# Patient Record
Sex: Female | Born: 1975 | Race: Black or African American | Hispanic: No | Marital: Single | Smoking: Never smoker
Health system: Southern US, Community
[De-identification: ages and names within clinical notes are randomized; demographics above are authoritative.]

## PROBLEM LIST (undated history)

## (undated) DIAGNOSIS — S83242A Other tear of medial meniscus, current injury, left knee, initial encounter: Secondary | ICD-10-CM

## (undated) DIAGNOSIS — D219 Benign neoplasm of connective and other soft tissue, unspecified: Secondary | ICD-10-CM

## (undated) DIAGNOSIS — S83105A Unspecified dislocation of left knee, initial encounter: Secondary | ICD-10-CM

## (undated) DIAGNOSIS — M25562 Pain in left knee: Secondary | ICD-10-CM

## (undated) DIAGNOSIS — D649 Anemia, unspecified: Secondary | ICD-10-CM

## (undated) DIAGNOSIS — I10 Essential (primary) hypertension: Secondary | ICD-10-CM

## (undated) HISTORY — DX: Benign neoplasm of connective and other soft tissue, unspecified: D21.9

---

## 1999-01-13 ENCOUNTER — Encounter: Admission: RE | Admit: 1999-01-13 | Discharge: 1999-01-13 | Payer: Self-pay | Admitting: Obstetrics & Gynecology

## 2010-12-28 ENCOUNTER — Emergency Department (HOSPITAL_COMMUNITY): Payer: Self-pay

## 2010-12-28 ENCOUNTER — Emergency Department (HOSPITAL_COMMUNITY)
Admission: EM | Admit: 2010-12-28 | Discharge: 2010-12-29 | Disposition: A | Discharge status: Home / Self Care | Payer: Self-pay | Attending: Emergency Medicine | Admitting: Emergency Medicine

## 2010-12-28 DIAGNOSIS — R071 Chest pain on breathing: Secondary | ICD-10-CM | POA: Insufficient documentation

## 2010-12-28 DIAGNOSIS — R079 Chest pain, unspecified: Secondary | ICD-10-CM | POA: Insufficient documentation

## 2010-12-28 DIAGNOSIS — R55 Syncope and collapse: Secondary | ICD-10-CM | POA: Insufficient documentation

## 2010-12-28 LAB — CBC
Hemoglobin: 11.2 g/dL — ABNORMAL LOW (ref 12.0–15.0)
MCH: 21.6 pg — ABNORMAL LOW (ref 26.0–34.0)
MCV: 66.1 fL — ABNORMAL LOW (ref 78.0–100.0)
Platelets: 238 10*3/uL (ref 150–400)
RBC: 5.19 MIL/uL — ABNORMAL HIGH (ref 3.87–5.11)

## 2010-12-28 LAB — BASIC METABOLIC PANEL
CO2: 26 mEq/L (ref 19–32)
Glucose, Bld: 109 mg/dL — ABNORMAL HIGH (ref 70–99)
Potassium: 4 mEq/L (ref 3.5–5.1)
Sodium: 138 mEq/L (ref 135–145)

## 2010-12-28 LAB — DIFFERENTIAL
Eosinophils Absolute: 0 10*3/uL (ref 0.0–0.7)
Lymphs Abs: 1.9 10*3/uL (ref 0.7–4.0)
Monocytes Relative: 8 % (ref 3–12)
Neutrophils Relative %: 70 % (ref 43–77)

## 2010-12-28 LAB — CK TOTAL AND CKMB (NOT AT ARMC)
CK, MB: 3.7 ng/mL (ref 0.3–4.0)
CK, MB: 3.9 ng/mL (ref 0.3–4.0)
Relative Index: 1.5 (ref 0.0–2.5)
Total CK: 307 U/L — ABNORMAL HIGH (ref 7–177)

## 2010-12-28 LAB — TROPONIN I: Troponin I: <0.3 ng/mL (ref <0.30)

## 2012-04-29 ENCOUNTER — Encounter (HOSPITAL_COMMUNITY): Payer: Self-pay | Admitting: *Deleted

## 2012-04-29 ENCOUNTER — Emergency Department (HOSPITAL_COMMUNITY): Payer: Medicaid Other

## 2012-04-29 ENCOUNTER — Emergency Department (HOSPITAL_COMMUNITY)
Admission: EM | Admit: 2012-04-29 | Discharge: 2012-04-29 | Disposition: A | Discharge status: Home / Self Care | Payer: Medicaid Other | Attending: Emergency Medicine | Admitting: Emergency Medicine

## 2012-04-29 DIAGNOSIS — S90129A Contusion of unspecified lesser toe(s) without damage to nail, initial encounter: Secondary | ICD-10-CM

## 2012-04-29 DIAGNOSIS — Y939 Activity, unspecified: Secondary | ICD-10-CM | POA: Insufficient documentation

## 2012-04-29 DIAGNOSIS — W208XXA Other cause of strike by thrown, projected or falling object, initial encounter: Secondary | ICD-10-CM | POA: Insufficient documentation

## 2012-04-29 DIAGNOSIS — Y929 Unspecified place or not applicable: Secondary | ICD-10-CM | POA: Insufficient documentation

## 2012-04-29 MED ORDER — HYDROCODONE-ACETAMINOPHEN 5-325 MG PO TABS: 2.0000 | ORAL_TABLET | Freq: Four times a day (QID) | ORAL | Status: DC | PRN

## 2012-04-29 NOTE — ED Notes (Signed)
Pt reports dropping a toy chest on her left foot yesterday, having pain to left big toe, radiates into her leg. Ambulatory at triage.

## 2012-04-29 NOTE — ED Provider Notes (Signed)
History     CSN: [id number]  Arrival date & time 04/29/12  1029   First MD Initiated Contact with Patient 04/29/12 1134      Chief Complaint  Patient presents with  . Toe Injury    (Consider location/radiation/quality/duration/timing/severity/associated sxs/prior treatment) HPI Comments: Dropped toy chest on foot last night.  Pain and swelling since.  Worse with ambulation, movement.  Better with rest.    The history is provided by the patient.    History reviewed. No pertinent past medical history.  History reviewed. No pertinent past surgical history.  History reviewed. No pertinent family history.  History  Substance Use Topics  . Smoking status: Not on file  . Smokeless tobacco: Not on file  . Alcohol Use: No    OB History   Grav Para Term Preterm Abortions TAB SAB Ect Mult Living                  Review of Systems  All other systems reviewed and are negative.    Allergies  Penicillins  Home Medications   Current Outpatient Rx  Name  Route  Sig  Dispense  Refill  . ibuprofen (ADVIL,MOTRIN) 800 MG tablet   Oral   Take 400 mg by mouth once.         . Omega-3 Fatty Acids (FISH OIL PO)   Oral   Take 2 capsules by mouth 2 (two) times daily.           BP 133/75  Pulse 91  Temp(Src) 98.2 F (36.8 C) (Oral)  Resp 18  SpO2 99%  LMP 04/01/2012  Physical Exam  Nursing note and vitals reviewed. Constitutional: She is oriented to person, place, and time. She appears well-developed and well-nourished.  HENT:  Head: Normocephalic and atraumatic.  Neck: Normal range of motion.  Musculoskeletal:  There is mild swelling, significant ttp over the left mtp joint and left great toe.  There is no deformity.  Neurological: She is alert and oriented to person, place, and time.  Skin: Skin is warm and dry.    ED Course  Procedures (including critical care time)  Labs Reviewed - No data to display Dg Foot Complete Left  04/29/2012  *RADIOLOGY REPORT*   Clinical Data: Object fell on foot with pain in the great toe  LEFT FOOT - COMPLETE 3+ VIEW  Comparison: None.  Findings: Tarsal - metatarsal alignment is normal.  No acute fracture is seen.  Joint spaces appear normal.  IMPRESSION: Negative.   Original Report Authenticated By: Dwyane Dee, M.D.      No diagnosis found.    MDM  Contusion of toe.  Treat with ice, pain meds.  Return prn.        Geoffery Lyons, MD 04/29/12 [phone]

## 2018-03-01 ENCOUNTER — Emergency Department (HOSPITAL_COMMUNITY)
Admission: EM | Admit: 2018-03-01 | Discharge: 2018-03-01 | Disposition: A | Discharge status: Home / Self Care | Payer: Medicaid Other | Attending: Emergency Medicine | Admitting: Emergency Medicine

## 2018-03-01 ENCOUNTER — Emergency Department (HOSPITAL_COMMUNITY): Payer: Medicaid Other

## 2018-03-01 ENCOUNTER — Other Ambulatory Visit: Payer: Self-pay

## 2018-03-01 ENCOUNTER — Encounter (HOSPITAL_COMMUNITY): Payer: Self-pay

## 2018-03-01 DIAGNOSIS — X509XXA Other and unspecified overexertion or strenuous movements or postures, initial encounter: Secondary | ICD-10-CM | POA: Diagnosis not present

## 2018-03-01 DIAGNOSIS — Y990 Civilian activity done for income or pay: Secondary | ICD-10-CM | POA: Insufficient documentation

## 2018-03-01 DIAGNOSIS — Z79899 Other long term (current) drug therapy: Secondary | ICD-10-CM | POA: Diagnosis not present

## 2018-03-01 DIAGNOSIS — Y9289 Other specified places as the place of occurrence of the external cause: Secondary | ICD-10-CM | POA: Insufficient documentation

## 2018-03-01 DIAGNOSIS — M2392 Unspecified internal derangement of left knee: Secondary | ICD-10-CM | POA: Diagnosis not present

## 2018-03-01 DIAGNOSIS — Y9389 Activity, other specified: Secondary | ICD-10-CM | POA: Diagnosis not present

## 2018-03-01 DIAGNOSIS — M25562 Pain in left knee: Secondary | ICD-10-CM | POA: Diagnosis present

## 2018-03-01 HISTORY — DX: Anemia, unspecified: D64.9

## 2018-03-01 NOTE — ED Triage Notes (Signed)
Pt reports left knee pain and swelling for 1 week, pt states she hears a "pop" when she walks on it.

## 2018-03-01 NOTE — ED Notes (Signed)
Ortho tech paged at this time

## 2018-03-01 NOTE — ED Provider Notes (Signed)
[facility] Provider Note   CSN: [id number] Arrival date & time: 03/01/18  1256     History   Chief Complaint Chief Complaint  Patient presents with  . Knee Pain    HPI Nancy Davis is a 42 y.o. female.  HPI Patient with left knee pain.  Around a week ago she hurt it.  States she was working and felt a pop in her left knee.  States there was a swelling on the top part of the knee after it.  States she has had pain since.  Some pain with moving it.  States she used to get cortisone in it after previous injuries with sports.  Pain is continued.  No fevers.  No other injury.  No numbness. Past Medical History:  Diagnosis Date  . Anemia     There are no active problems to display for this patient.   Past Surgical History:  Procedure Laterality Date  . CESAREAN SECTION  2007     OB History   None      Home Medications    Prior to Admission medications   Medication Sig Start Date End Date Taking? Authorizing Provider  HYDROcodone-acetaminophen (NORCO/VICODIN) 5-325 MG per tablet Take 2 tablets by mouth every 6 (six) hours as needed for pain. 04/29/12   Veryl Speak, MD  ibuprofen (ADVIL,MOTRIN) 800 MG tablet Take 400 mg by mouth once.    [provider]  Omega-3 Fatty Acids (FISH OIL PO) Take 2 capsules by mouth 2 (two) times daily.    [provider]    Family History No family history on file.  Social History Social History   Tobacco Use  . Smoking status: Not on file  Substance Use Topics  . Alcohol use: No  . Drug use: No     Allergies   Penicillins   Review of Systems Review of Systems  Constitutional: Negative for appetite change and fever.  Musculoskeletal:       Left knee pain.  Skin: Negative for rash and wound.  Neurological: Negative for weakness and numbness.     Physical Exam Updated Vital Signs BP (!) 153/81 (BP Location: Right Arm)   Pulse 77   Temp 98.5 F (36.9 C)  (Oral)   Resp 18   Ht 5\' 8"  (1.727 m)   Wt 85.3 kg   SpO2 99%   BMI 28.59 kg/m   Physical Exam  Constitutional: She appears well-developed.  Musculoskeletal: She exhibits tenderness.  Some swelling of the left knee.  Decreased range of motion due to pain.  Pain with anterior drawer testing.  Less pain with varus and valgus strain.  Some tenderness over anterior knee.  No erythema.  Skin: Skin is warm. Capillary refill takes less than 2 seconds.     ED Treatments / Results  Labs (all labs ordered are listed, but only abnormal results are displayed) Labs Reviewed - No data to display  EKG None  Radiology Dg Knee Complete 4 Views Left  Result Date: 03/01/2018 CLINICAL DATA:  Anterior left knee pain and swelling. The patient feels like the knee popped out of place and back in one week ago. EXAM: LEFT KNEE - COMPLETE 4+ VIEW COMPARISON:  None. FINDINGS: There is a moderate knee joint effusion. No acute fracture or dislocation is identified. Joint space widths are preserved. A punctate calcification is noted in the soft tissues adjacent to the fibular head. There is mild soft tissue swelling anterior to the  knee. IMPRESSION: Moderate knee joint effusion without acute osseous abnormality identified. Electronically Signed   By: Logan Bores M.D.   On: 03/01/2018 13:38    Procedures Procedures (including critical care time)  Medications Ordered in ED Medications - No data to display   Initial Impression / Assessment and Plan / ED Course  I have reviewed the triage vital signs and the nursing notes.  Pertinent labs & imaging results that were available during my care of the patient were reviewed by me and considered in my medical decision making (see chart for details).     Patient with left knee pain.  Acute injury around a week ago.  Has effusion.  Likely some internal derangement.  Knee immobilizer.  Orthopedic follow-up.  Final Clinical Impressions(s) / ED Diagnoses   Final  diagnoses:  Internal derangement of left knee    ED Discharge Orders    None       Davonna Belling, MD 03/01/18 1352

## 2018-03-01 NOTE — ED Notes (Signed)
ED Provider at bedside. 

## 2018-03-02 ENCOUNTER — Ambulatory Visit (INDEPENDENT_AMBULATORY_CARE_PROVIDER_SITE_OTHER): Payer: Medicaid Other | Admitting: Orthopaedic Surgery

## 2018-03-02 DIAGNOSIS — M25462 Effusion, left knee: Secondary | ICD-10-CM

## 2018-03-02 MED ORDER — METHYLPREDNISOLONE ACETATE 40 MG/ML IJ SUSP
40.0000 mg | INTRAMUSCULAR | Status: AC | PRN
  Administered 2018-03-02: 40 mg via INTRA_ARTICULAR

## 2018-03-02 MED ORDER — LIDOCAINE HCL 1 % IJ SOLN
2.0000 mL | INTRAMUSCULAR | Status: AC | PRN
  Administered 2018-03-02: 2 mL

## 2018-03-02 MED ORDER — BUPIVACAINE HCL 0.5 % IJ SOLN
2.0000 mL | INTRAMUSCULAR | Status: AC | PRN
  Administered 2018-03-02: 2 mL via INTRA_ARTICULAR

## 2018-03-02 NOTE — Addendum Note (Signed)
Addended by: Precious Bard on: 03/02/2018 11:37 AM   Modules accepted: Orders

## 2018-03-02 NOTE — Progress Notes (Signed)
Office Visit Note   Patient: Nancy Davis           Date of Birth: 13-Jan-1976           MRN: [id number] Visit Date: 03/02/2018              Requested by: No referring provider defined for this encounter. PCP: Default, Provider, MD   Assessment & Plan: Visit Diagnoses:  1. Effusion, left knee     Plan: Impression is left knee medial meniscal tear concerning for a bucket-handle.  X-rays from the ED are unremarkable other than large joint effusion.  We performed a left knee aspiration and cortisone injection today.  I am worried that she has a large medial meniscal tear possibly bucket-handle which needs further investigation with an MRI.  We will see her back after the MRI.  Work note was provided today for desk duty until she follows up.  Follow-Up Instructions: Return in about 10 days (around 03/12/2018).   Orders:  No orders of the defined types were placed in this encounter.  No orders of the defined types were placed in this encounter.     Procedures: Large Joint Inj: L knee on 03/02/2018 9:50 AM Details: 22 G needle Medications: 2 mL bupivacaine 0.5 %; 2 mL lidocaine 1 %; 40 mg methylPREDNISolone acetate 40 MG/ML Aspirate: 25 mL clear Outcome: tolerated well, no immediate complications Patient was prepped and draped in the usual sterile fashion.       Clinical Data: No additional findings.   Subjective: Chief Complaint  Patient presents with  . Left Knee - Pain    Nancy Davis is a very pleasant 42 year old female who acutely developed left knee pain about a week ago when she was walking at work.  She did not have an injury but she felt a pop in her left knee on the medial side and had immediate pain and swelling and difficulty with weightbearing walking.  She was sent home from work.  She has had previous left knee injuries and she has had multiple cortisone injections.  She comes in today with pain and knee effusion and significant mechanical  symptoms.   Review of Systems  Constitutional: Negative.   HENT: Negative.   Eyes: Negative.   Respiratory: Negative.   Cardiovascular: Negative.   Endocrine: Negative.   Musculoskeletal: Negative.   Neurological: Negative.   Hematological: Negative.   Psychiatric/Behavioral: Negative.   All other systems reviewed and are negative.    Objective: Vital Signs: There were no vitals taken for this visit.  Physical Exam Vitals signs and nursing note reviewed.  Constitutional:      Appearance: She is well-developed.  HENT:     Head: Normocephalic and atraumatic.  Neck:     Musculoskeletal: Neck supple.  Pulmonary:     Effort: Pulmonary effort is normal.  Abdominal:     Palpations: Abdomen is soft.  Skin:    General: Skin is warm.     Capillary Refill: Capillary refill takes less than 2 seconds.  Neurological:     Mental Status: She is alert and oriented to person, place, and time.  Psychiatric:        Behavior: Behavior normal.        Thought Content: Thought content normal.        Judgment: Judgment normal.     Ortho Exam Left knee exam shows significant difficulty with ambulation with frequent buckling and giving way.  She exhibits a large left knee  joint effusion.  Collaterals and cruciates are stable.  She has exquisite medial joint line tenderness with a positive McMurray. Specialty Comments:  No specialty comments available.  Imaging: Dg Knee Complete 4 Views Left  Result Date: 03/01/2018 CLINICAL DATA:  Anterior left knee pain and swelling. The patient feels like the knee popped out of place and back in one week ago. EXAM: LEFT KNEE - COMPLETE 4+ VIEW COMPARISON:  None. FINDINGS: There is a moderate knee joint effusion. No acute fracture or dislocation is identified. Joint space widths are preserved. A punctate calcification is noted in the soft tissues adjacent to the fibular head. There is mild soft tissue swelling anterior to the knee. IMPRESSION: Moderate  knee joint effusion without acute osseous abnormality identified. Electronically Signed   By: Logan Bores M.D.   On: 03/01/2018 13:38     PMFS History: There are no active problems to display for this patient.  Past Medical History:  Diagnosis Date  . Anemia     No family history on file.  Past Surgical History:  Procedure Laterality Date  . CESAREAN SECTION  2007   Social History   Occupational History  . Not on file  Tobacco Use  . Smoking status: Not on file  Substance and Sexual Activity  . Alcohol use: No  . Drug use: No  . Sexual activity: Not on file

## 2018-04-09 ENCOUNTER — Encounter (HOSPITAL_COMMUNITY): Payer: Self-pay

## 2018-04-09 ENCOUNTER — Emergency Department (HOSPITAL_COMMUNITY)
Admission: EM | Admit: 2018-04-09 | Discharge: 2018-04-09 | Disposition: A | Discharge status: Home / Self Care | Payer: Medicaid Other | Attending: Emergency Medicine | Admitting: Emergency Medicine

## 2018-04-09 DIAGNOSIS — Z79899 Other long term (current) drug therapy: Secondary | ICD-10-CM | POA: Insufficient documentation

## 2018-04-09 DIAGNOSIS — M25562 Pain in left knee: Secondary | ICD-10-CM | POA: Diagnosis present

## 2018-04-09 DIAGNOSIS — M1710 Unilateral primary osteoarthritis, unspecified knee: Secondary | ICD-10-CM

## 2018-04-09 DIAGNOSIS — M25462 Effusion, left knee: Secondary | ICD-10-CM | POA: Diagnosis not present

## 2018-04-09 HISTORY — DX: Pain in left knee: M25.562

## 2018-04-09 MED ORDER — NAPROXEN 250 MG PO TABS
500.0000 mg | ORAL_TABLET | Freq: Once | ORAL | Status: AC
  Administered 2018-04-09: 500 mg via ORAL
  Filled 2018-04-09: qty 2

## 2018-04-09 MED ORDER — HYDROCODONE-ACETAMINOPHEN 5-325 MG PO TABS
1.0000 | ORAL_TABLET | Freq: Once | ORAL | Status: AC
  Administered 2018-04-09: 1 via ORAL
  Filled 2018-04-09: qty 1

## 2018-04-09 NOTE — Progress Notes (Signed)
Orthopedic Tech Progress Note Patient Details:  Nancy Davis Mar 10, 1976 [id number]  Ortho Devices Type of Ortho Device: Knee Sleeve Ortho Device/Splint Location: LLE Ortho Device/Splint Interventions: Adjustment, Application, Ordered   Post Interventions Patient Tolerated: Well Instructions Provided: Care of device, Adjustment of device   [name] 04/09/2018, 1:12 PM

## 2018-04-09 NOTE — ED Provider Notes (Signed)
[facility]PART[facility] Provider Note   CSN: [id number] Arrival date & time: 04/09/18  1208     History   Chief Complaint Chief Complaint  Patient presents with  . Knee Pain    HPI Nancy Davis is a 43 y.o. female.  HPI  43 year old female comes in with chief complaint of knee pain. Patient has known history of chronic knee injury to the left side, and she saw orthopedic service last month and had her knee aspirated and was given hydrocortisone shot.  Patient reports that her symptoms improved for a few days, but have slowly started getting worse and now she has any swelling.  She has sharp and stabbing pain to her knee that is fairly constant and it is worse at nighttime.  Patient has been taking NSAIDs without significant relief.  She denies any new trauma.  Past Medical History:  Diagnosis Date  . Anemia   . Knee pain, left    from old injury playing sports in high school,     There are no active problems to display for this patient.   Past Surgical History:  Procedure Laterality Date  . CESAREAN SECTION  2007     OB History   No obstetric history on file.      Home Medications    Prior to Admission medications   Medication Sig Start Date End Date Taking? Authorizing Provider  HYDROcodone-acetaminophen (NORCO/VICODIN) 5-325 MG per tablet Take 2 tablets by mouth every 6 (six) hours as needed for pain. 04/29/12   Veryl Speak, MD  ibuprofen (ADVIL,MOTRIN) 800 MG tablet Take 400 mg by mouth once.    [provider]  Omega-3 Fatty Acids (FISH OIL PO) Take 2 capsules by mouth 2 (two) times daily.    [provider]    Family History History reviewed. No pertinent family history.  Social History Social History   Tobacco Use  . Smoking status: Never Smoker  Substance Use Topics  . Alcohol use: No  . Drug use: No     Allergies   Penicillins   Review of Systems Review of Systems  Constitutional:  Positive for activity change. Negative for fever.  Musculoskeletal: Positive for arthralgias.  Allergic/Immunologic: Negative for immunocompromised state.     Physical Exam Updated Vital Signs BP 129/72   Pulse 88   Temp 98 F (36.7 C) (Oral)   Resp 16   LMP 03/22/2018   SpO2 99%   Physical Exam Vitals signs and nursing note reviewed.  Constitutional:      Appearance: She is well-developed.  HENT:     Head: Atraumatic.  Cardiovascular:     Rate and Rhythm: Normal rate.  Pulmonary:     Effort: Pulmonary effort is normal.  Musculoskeletal:        General: Swelling and tenderness present. No deformity or signs of injury.     Comments: Left knee has effusion without any erythema, warmth to touch  Skin:    General: Skin is warm and dry.  Neurological:     Mental Status: She is alert and oriented to person, place, and time.      ED Treatments / Results  Labs (all labs ordered are listed, but only abnormal results are displayed) Labs Reviewed - No data to display  EKG None  Radiology No results found.  Procedures Procedures (including critical care time)  Medications Ordered in ED Medications  naproxen (NAPROSYN) tablet 500 mg (has no administration in time range)  HYDROcodone-acetaminophen (NORCO/VICODIN) 5-325 MG per tablet 1 tablet (has no administration in time range)     Initial Impression / Assessment and Plan / ED Course  I have reviewed the triage vital signs and the nursing notes.  Pertinent labs & imaging results that were available during my care of the patient were reviewed by me and considered in my medical decision making (see chart for details).     43 year old comes in a chief complaint of knee pain. Clinically it appears that patient is having reaccumulation of the effusion secondary to her chronic knee issues.  Is already seeing an orthopedist, and has had joint aspiration with transient relief.  Clinical concern for septic arthritis at  this time.  We will provide a knee sleeve for extra support.  Norco for pain control right now, however at home she will be on NSAIDs.  To follow-up recommended for further evaluation.  Final Clinical Impressions(s) / ED Diagnoses   Final diagnoses:  Effusion of left knee  Arthritis of knee    ED Discharge Orders    None       Varney Biles, MD 04/09/18 1252

## 2018-04-09 NOTE — ED Triage Notes (Signed)
Onset last night left knee pain and swelling with h/o same from "old" sports injury. No recent injuries to knee. Unable to bear full weight on left leg.

## 2018-04-09 NOTE — Discharge Instructions (Addendum)
Please see your orthopedic doctor for further evaluation of your knee swelling and pain. Continue taking ibuprofen 400 to 600 mg 4 times a day. Keep the knee sleeve on at all times for extra support. Continue with ice and heat therapy 4 times a day.

## 2018-04-18 ENCOUNTER — Encounter (INDEPENDENT_AMBULATORY_CARE_PROVIDER_SITE_OTHER): Payer: Self-pay | Admitting: *Deleted

## 2018-09-23 ENCOUNTER — Other Ambulatory Visit: Payer: Self-pay

## 2018-09-23 ENCOUNTER — Emergency Department (HOSPITAL_COMMUNITY): Payer: Medicaid Other

## 2018-09-23 ENCOUNTER — Emergency Department (HOSPITAL_COMMUNITY)
Admission: EM | Admit: 2018-09-23 | Discharge: 2018-09-24 | Disposition: A | Discharge status: Home / Self Care | Payer: Medicaid Other | Attending: Emergency Medicine | Admitting: Emergency Medicine

## 2018-09-23 ENCOUNTER — Encounter (HOSPITAL_COMMUNITY): Payer: Self-pay | Admitting: *Deleted

## 2018-09-23 DIAGNOSIS — S8391XA Sprain of unspecified site of right knee, initial encounter: Secondary | ICD-10-CM | POA: Diagnosis not present

## 2018-09-23 DIAGNOSIS — Y929 Unspecified place or not applicable: Secondary | ICD-10-CM | POA: Insufficient documentation

## 2018-09-23 DIAGNOSIS — Y9389 Activity, other specified: Secondary | ICD-10-CM | POA: Diagnosis not present

## 2018-09-23 DIAGNOSIS — Y999 Unspecified external cause status: Secondary | ICD-10-CM | POA: Insufficient documentation

## 2018-09-23 DIAGNOSIS — S8991XA Unspecified injury of right lower leg, initial encounter: Secondary | ICD-10-CM | POA: Diagnosis present

## 2018-09-23 DIAGNOSIS — X58XXXA Exposure to other specified factors, initial encounter: Secondary | ICD-10-CM | POA: Diagnosis not present

## 2018-09-23 MED ORDER — OXYCODONE-ACETAMINOPHEN 5-325 MG PO TABS
1.0000 | ORAL_TABLET | Freq: Once | ORAL | Status: AC
  Administered 2018-09-23: 1 via ORAL
  Filled 2018-09-23: qty 1

## 2018-09-23 NOTE — ED Provider Notes (Signed)
[facility] EMERGENCY DEPARTMENT Provider Note   CSN: [id number] Arrival date & time: 09/23/18  2126     History   Chief Complaint Chief Complaint  Patient presents with  . Knee Pain    HPI Nancy Davis is a 43 y.o. female.     Patient to ED with complaint of right knee pain and swelling since earlier today. She reports a remote sports injury that causes periodic pain and swelling of the knee. She woke this morning with these symptoms but later in the day was walking down steps when she felt the knee give way. She did not fall but had sudden pain and has difficulty walking since that time. She has tried ice and elevation, and has been taking ibuprofen and Tylenol without relief. No numbness.   The history is provided by the patient. No language interpreter was used.  Knee Pain   Past Medical History:  Diagnosis Date  . Anemia   . Knee pain, left    from old injury playing sports in high school,     There are no active problems to display for this patient.   Past Surgical History:  Procedure Laterality Date  . CESAREAN SECTION  2007     OB History   No obstetric history on file.      Home Medications    Prior to Admission medications   Medication Sig Start Date End Date Taking? Authorizing Provider  HYDROcodone-acetaminophen (NORCO/VICODIN) 5-325 MG per tablet Take 2 tablets by mouth every 6 (six) hours as needed for pain. 04/29/12   Veryl Speak, MD  ibuprofen (ADVIL,MOTRIN) 800 MG tablet Take 400 mg by mouth once.    [provider]  Omega-3 Fatty Acids (FISH OIL PO) Take 2 capsules by mouth 2 (two) times daily.    [provider]    Family History No family history on file.  Social History Social History   Tobacco Use  . Smoking status: Never Smoker  Substance Use Topics  . Alcohol use: No  . Drug use: No     Allergies   Penicillins   Review of Systems Review of Systems  Musculoskeletal: Positive for  arthralgias.       See HPI.  Skin: Negative for color change and wound.  Neurological: Negative for numbness.     Physical Exam Updated Vital Signs BP (!) 169/100   Pulse 77   Temp 98.5 F (36.9 C) (Oral)   Resp 16   LMP 08/24/2018   SpO2 99%   Physical Exam Vitals signs and nursing note reviewed.  Constitutional:      Appearance: She is well-developed.  Neck:     Musculoskeletal: Normal range of motion.  Cardiovascular:     Pulses: Normal pulses.  Pulmonary:     Effort: Pulmonary effort is normal.  Musculoskeletal:     Comments: Right knee swollen medially, possible small effusion. No bony deformity. Laxity cannot be determined due to limitation of pain on exam. No calf or thigh tenderness.   Skin:    General: Skin is warm and dry.  Neurological:     Mental Status: She is alert and oriented to person, place, and time.      ED Treatments / Results  Labs (all labs ordered are listed, but only abnormal results are displayed) Labs Reviewed - No data to display  EKG None  Radiology Dg Knee Complete 4 Views Right  Result Date: 09/23/2018 CLINICAL DATA:  Knee pain EXAM: RIGHT  KNEE - COMPLETE 4+ VIEW COMPARISON:  None. FINDINGS: No acute displaced fracture or malalignment. Mild medial degenerative changes. Possible small knee effusion with associated fat density. IMPRESSION: 1. No definite acute displaced fracture is seen. 2. Possible small knee effusion with associated fat density. Consider CT to exclude radiographically occult fracture Electronically Signed   By: Donavan Foil M.D.   On: 09/23/2018 22:37    Procedures Procedures (including critical care time)  Medications Ordered in ED Medications  oxyCODONE-acetaminophen (PERCOCET/ROXICET) 5-325 MG per tablet 1 tablet (has no administration in time range)     Initial Impression / Assessment and Plan / ED Course  I have reviewed the triage vital signs and the nursing notes.  Pertinent labs & imaging results  that were available during my care of the patient were reviewed by me and considered in my medical decision making (see chart for details).        Patient to ED with knee pain and swelling today. Increased pain and difficulty weight bearing when the knee "gave out" while walking down steps.   The mechanism of injury suggests ligamentous injury/strain. No evidence of rupture. Plain film of the knee shows small effusion, consider CT to exclude occult fracture.  The patient was given the choice to treat symptomatically: with or without CT, immobilization, continue cool compresses, pain control, ortho follow up. She requests CT study to be done.   Pain management provided. CT pending.   CT shows effusion without fracture. Knee Immobilizer and crutches provided, which is providing comfort. Will discharge home with pain control, ortho follow up.  Final Clinical Impressions(s) / ED Diagnoses   Final diagnoses:  None   1. Right knee sprain  ED Discharge Orders    None       Charlann Lange, PA-C 09/24/18 [place], [place], DO 09/24/18 2340

## 2018-09-23 NOTE — ED Triage Notes (Signed)
Pt c/o R knee pain; has previous injury; was walking down the steps and heard it pop; c/o increased pain on ambulation. Had tried ice and ibuprofen without relief

## 2018-09-24 MED ORDER — HYDROCODONE-ACETAMINOPHEN 5-325 MG PO TABS: 1.0000 | ORAL_TABLET | Freq: Four times a day (QID) | ORAL | 0 refills | Status: DC | PRN

## 2018-09-24 MED ORDER — IBUPROFEN 600 MG PO TABS: 600.0000 mg | ORAL_TABLET | Freq: Four times a day (QID) | ORAL | 0 refills | Status: DC | PRN

## 2018-09-24 NOTE — Discharge Instructions (Addendum)
Take medications as prescribed. Use knee immobilizer when walking, and crutches to be weight bearing as tolerated.   If pain and swelling persist, follow up with orthopedics for further evaluation.

## 2018-09-24 NOTE — ED Notes (Signed)
Patient verbalizes understanding of discharge instructions. Opportunity for questioning and answers were provided. Armband removed by staff, pt discharged from ED by wheelchair with significant other to pick up pt

## 2018-11-23 ENCOUNTER — Encounter (HOSPITAL_COMMUNITY): Payer: Self-pay

## 2018-11-23 ENCOUNTER — Other Ambulatory Visit: Payer: Self-pay

## 2018-11-23 ENCOUNTER — Emergency Department (HOSPITAL_COMMUNITY): Payer: Medicaid Other

## 2018-11-23 ENCOUNTER — Emergency Department (HOSPITAL_COMMUNITY)
Admission: EM | Admit: 2018-11-23 | Discharge: 2018-11-23 | Disposition: A | Discharge status: Home / Self Care | Payer: Medicaid Other | Attending: Emergency Medicine | Admitting: Emergency Medicine

## 2018-11-23 DIAGNOSIS — M25511 Pain in right shoulder: Secondary | ICD-10-CM

## 2018-11-23 DIAGNOSIS — Z79899 Other long term (current) drug therapy: Secondary | ICD-10-CM | POA: Insufficient documentation

## 2018-11-23 MED ORDER — METHOCARBAMOL 500 MG PO TABS: 500.0000 mg | ORAL_TABLET | Freq: Two times a day (BID) | ORAL | 0 refills | Status: DC

## 2018-11-23 NOTE — Discharge Instructions (Signed)
Use Robaxin for pain at night.  Do not drink alcohol or operate heavy machinery with this medication can cause drowsiness.  Read attached information on shoulder exercises and musculoskeletal pain.  Follow-up with orthopedist as discussed. Use ibuprofen and tylenol for pain management as discussed.
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
## 2018-11-23 NOTE — ED Triage Notes (Signed)
Pt presents with Right shoulder pain x1-2 weeks

## 2019-07-25 ENCOUNTER — Emergency Department (HOSPITAL_COMMUNITY): Payer: Medicaid Other

## 2019-07-25 ENCOUNTER — Emergency Department (HOSPITAL_COMMUNITY)
Admission: EM | Admit: 2019-07-25 | Discharge: 2019-07-25 | Disposition: A | Discharge status: Home / Self Care | Payer: Medicaid Other | Attending: Emergency Medicine | Admitting: Emergency Medicine

## 2019-07-25 DIAGNOSIS — R042 Hemoptysis: Secondary | ICD-10-CM | POA: Diagnosis not present

## 2019-07-25 DIAGNOSIS — J40 Bronchitis, not specified as acute or chronic: Secondary | ICD-10-CM | POA: Diagnosis not present

## 2019-07-25 LAB — CBC
HCT: 37.9 % (ref 36.0–46.0)
Hemoglobin: 11.2 g/dL — ABNORMAL LOW (ref 12.0–15.0)
MCH: 20.4 pg — ABNORMAL LOW (ref 26.0–34.0)
MCHC: 29.6 g/dL — ABNORMAL LOW (ref 30.0–36.0)
MCV: 69 fL — ABNORMAL LOW (ref 80.0–100.0)
Platelets: 253 10*3/uL (ref 150–400)
RBC: 5.49 MIL/uL — ABNORMAL HIGH (ref 3.87–5.11)
RDW: 15.2 % (ref 11.5–15.5)
WBC: 6.1 10*3/uL (ref 4.0–10.5)
nRBC: 0 % (ref 0.0–0.2)

## 2019-07-25 LAB — BASIC METABOLIC PANEL
Anion gap: 7 (ref 5–15)
BUN: 16 mg/dL (ref 6–20)
CO2: 25 mmol/L (ref 22–32)
Calcium: 9 mg/dL (ref 8.9–10.3)
Chloride: 107 mmol/L (ref 98–111)
Creatinine, Ser: 0.84 mg/dL (ref 0.44–1.00)
GFR calc Af Amer: >60 mL/min (ref >60)
GFR calc non Af Amer: >60 mL/min (ref >60)
Glucose, Bld: 95 mg/dL (ref 70–99)
Potassium: 4.2 mmol/L (ref 3.5–5.1)
Sodium: 139 mmol/L (ref 135–145)

## 2019-07-25 LAB — I-STAT BETA HCG BLOOD, ED (MC, WL, AP ONLY): I-stat hCG, quantitative: <5 m[IU]/mL (ref <5)

## 2019-07-25 MED ORDER — KETOROLAC TROMETHAMINE 15 MG/ML IJ SOLN
15.0000 mg | Freq: Once | INTRAMUSCULAR | Status: AC
  Administered 2019-07-25: 15 mg via INTRAVENOUS
  Filled 2019-07-25: qty 1

## 2019-07-25 MED ORDER — BENZONATATE 100 MG PO CAPS: 100.0000 mg | ORAL_CAPSULE | Freq: Three times a day (TID) | ORAL | 0 refills | Status: DC

## 2019-07-25 MED ORDER — IOHEXOL 350 MG/ML SOLN
80.0000 mL | Freq: Once | INTRAVENOUS | Status: AC | PRN
  Administered 2019-07-25: 80 mL via INTRAVENOUS

## 2019-07-25 MED ORDER — SODIUM CHLORIDE 0.9% FLUSH
3.0000 mL | Freq: Once | INTRAVENOUS | Status: AC
  Administered 2019-07-25: 12:00:00 3 mL via INTRAVENOUS

## 2019-07-25 NOTE — ED Notes (Signed)
Patient verbalizes understanding of discharge instructions. Opportunity for questioning and answers were provided. Armband removed by staff, pt discharged from ED.  

## 2019-07-25 NOTE — Discharge Instructions (Addendum)
You were evaluated in the Emergency Department and after careful evaluation, we did not find any emergent condition requiring admission or further testing in the hospital.  Your exam/testing today was overall reassuring.  CT scan did not show any signs of blood clots or any other emergencies.  We suspect your symptoms are due to bronchitis.  You can use the Tessalon anticough medication as needed.  Otherwise we recommend Tylenol 1000 mg every 4-6 hours, Motrin 600 mg every 4-6 hours for discomfort.  Please return to the Emergency Department if you experience any worsening of your condition.  We encourage you to follow up with a primary care provider.  Thank you for allowing [place] to be a part of your care.

## 2019-07-25 NOTE — ED Provider Notes (Signed)
[facility] Emergency Department Provider Note MRN:  [id number]  Arrival date & time: 07/25/19     Chief Complaint   Hemoptysis History of Present Illness   Nancy Davis is a 44 y.o. year-old female with a history of anemia presenting to the ED with chief complaint of hemoptysis.  1 week of persistent hemoptysis associated with dry cough, coughing up streaks of blood with mucus.  Also endorsing pleuritic central chest pain during this time, trouble taking deep breaths due to the pain.  Denies abdominal pain, no fever, mild nasal congestion.  She explains that she has a strong family history of blood clots on her mother side and her sister died of a blood clot last week.  Review of Systems  A complete 10 system review of systems was obtained and all systems are negative except as noted in the HPI and PMH.   Patient's Health History    Past Medical History:  Diagnosis Date  . Anemia   . Knee pain, left    from old injury playing sports in high school,     Past Surgical History:  Procedure Laterality Date  . CESAREAN SECTION  2007    No family history on file.  Social History   Socioeconomic History  . Marital status: Single    Spouse name: Not on file  . Number of children: Not on file  . Years of education: Not on file  . Highest education level: Not on file  Occupational History  . Not on file  Tobacco Use  . Smoking status: Never Smoker  . Smokeless tobacco: Never Used  Substance and Sexual Activity  . Alcohol use: No  . Drug use: No  . Sexual activity: Not on file  Other Topics Concern  . Not on file  Social History Narrative  . Not on file   Social Determinants of Health   Financial Resource Strain:   . Difficulty of Paying Living Expenses:   Food Insecurity:   . Worried About Charity fundraiser in the Last Year:   . Arboriculturist in the Last Year:   Transportation Needs:   . Film/video editor (Medical):   Marland Kitchen Lack of  Transportation (Non-Medical):   Physical Activity:   . Days of Exercise per Week:   . Minutes of Exercise per Session:   Stress:   . Feeling of Stress :   Social Connections:   . Frequency of Communication with Friends and Family:   . Frequency of Social Gatherings with Friends and Family:   . Attends Religious Services:   . Active Member of Clubs or Organizations:   . Attends Archivist Meetings:   Marland Kitchen Marital Status:   Intimate Partner Violence:   . Fear of Current or Ex-Partner:   . Emotionally Abused:   Marland Kitchen Physically Abused:   . Sexually Abused:      Physical Exam   Vitals:   07/25/19 1003  BP: (!) 151/85  Pulse: 82  Resp: 18  Temp: 98 F (36.7 C)  SpO2: 100%    CONSTITUTIONAL: Well-appearing, NAD NEURO:  Alert and oriented x 3, no focal deficits EYES:  eyes equal and reactive ENT/NECK:  no LAD, no JVD CARDIO: Regular rate, well-perfused, normal S1 and S2 PULM:  CTAB no wheezing or rhonchi GI/GU:  normal bowel sounds, non-distended, non-tender MSK/SPINE:  No gross deformities, no edema SKIN:  no rash, atraumatic PSYCH:  Appropriate speech and behavior  *Additional and/or  pertinent findings included in MDM below  Diagnostic and Interventional Summary    EKG Interpretation  Date/Time:  Wednesday Jul 25 2019 11:42:31 EDT Ventricular Rate:  66 PR Interval:    QRS Duration: 93 QT Interval:  414 QTC Calculation: 434 R Axis:   6 Text Interpretation: Sinus rhythm Confirmed by Gerlene Fee [phone]) on 07/25/2019 2:24:04 PM      Labs Reviewed  CBC - Abnormal; Notable for the following components:      Result Value   RBC 5.49 (*)    Hemoglobin 11.2 (*)    MCV 69.0 (*)    MCH 20.4 (*)    MCHC 29.6 (*)    All other components within normal limits  BASIC METABOLIC PANEL  I-STAT BETA HCG BLOOD, ED ([facility], [facility], [facility] ONLY)    CT ANGIO CHEST PE W OR WO CONTRAST  Final Result    DG Chest 2 View  Final Result      Medications  sodium chloride flush (NS)  0.9 % injection 3 mL (3 mLs Intravenous Given 07/25/19 1151)  ketorolac (TORADOL) 15 MG/ML injection 15 mg (15 mg Intravenous Given 07/25/19 1151)  iohexol (OMNIPAQUE) 350 MG/ML injection 80 mL (80 mLs Intravenous Contrast Given 07/25/19 1245)     Procedures  /  Critical Care Procedures  ED Course and Medical Decision Making  I have reviewed the triage vital signs, the nursing notes, and pertinent available records from the EMR.  Listed above are laboratory and imaging tests that I personally ordered, reviewed, and interpreted and then considered in my medical decision making (see below for details).      Given the mopped assist and pleuritic chest pain with the strong family history, will obtain CTA to exclude pulmonary embolism.  Work-up is reassuring, no PE on CTA.  Patient is feeling better, suspect mild hemoptysis due to bronchitis, appropriate for discharge.  Barth Kirks. Sedonia Small, [place] [email or web address]  Final Clinical Impressions(s) / ED Diagnoses     ICD-10-CM   1. Hemoptysis  R04.2   2. Bronchitis  J40     ED Discharge Orders         Ordered    benzonatate (TESSALON) 100 MG capsule  Every 8 hours     07/25/19 1425           Discharge Instructions Discussed with and Provided to Patient:     Discharge Instructions     You were evaluated in the Emergency Department and after careful evaluation, we did not find any emergent condition requiring admission or further testing in the hospital.  Your exam/testing today was overall reassuring.  CT scan did not show any signs of blood clots or any other emergencies.  We suspect your symptoms are due to bronchitis.  You can use the Tessalon anticough medication as needed.  Otherwise we recommend Tylenol 1000 mg every 4-6 hours, Motrin 600 mg every 4-6 hours for discomfort.  Please return to the Emergency Department if you experience any worsening of your condition.  We  encourage you to follow up with a primary care provider.  Thank you for allowing [place] to be a part of your care.        Maudie Flakes, MD 07/25/19 [phone]

## 2019-07-25 NOTE — ED Triage Notes (Signed)
Pt arrives pov with reports of coughing up blood X1 week. Pt also states it hurts to breathe. States she has had a cough/cold for the last few weeks. Endorses receiving covid vaccines.

## 2019-10-22 ENCOUNTER — Encounter (HOSPITAL_COMMUNITY): Payer: Self-pay | Admitting: Emergency Medicine

## 2019-10-22 ENCOUNTER — Emergency Department (HOSPITAL_COMMUNITY)
Admission: EM | Admit: 2019-10-22 | Discharge: 2019-10-22 | Disposition: A | Discharge status: Home / Self Care | Payer: Medicaid Other | Attending: Emergency Medicine | Admitting: Emergency Medicine

## 2019-10-22 ENCOUNTER — Emergency Department (HOSPITAL_COMMUNITY): Payer: Medicaid Other

## 2019-10-22 ENCOUNTER — Other Ambulatory Visit: Payer: Self-pay

## 2019-10-22 DIAGNOSIS — M25561 Pain in right knee: Secondary | ICD-10-CM | POA: Diagnosis present

## 2019-10-22 MED ORDER — TRAMADOL HCL 50 MG PO TABS: 50.0000 mg | ORAL_TABLET | Freq: Four times a day (QID) | ORAL | 0 refills | Status: DC | PRN

## 2019-10-22 NOTE — Discharge Instructions (Addendum)
It was our pleasure to provide your ER care today - we hope that you feel better.  Your xrays look good.   For pain, try ibuprofen or aleve as need.   You may also take ultram as need for pain - no driving when taking.   Follow up with orthopedist in the next few weeks - call office to arrange appointment.   Return to ER if worse, new symptoms, fevers, severe or intractable pain, severe redness/swelling, or other concern.

## 2019-10-22 NOTE — ED Provider Notes (Signed)
[place] EMERGENCY DEPARTMENT Provider Note   CSN: [id number] Arrival date & time: 10/22/19  1040     History Chief Complaint  Patient presents with  . Knee Pain    Nancy Davis is a 44 y.o. female.  Patient c/o right knee pain that shoots upwards to right anterior thigh. Symptoms presents for past several days, worse w walking/wt bearing. Symptoms gradual, onset, dull, moderate, worse w walking/certain movements. Denies specific injury. No trauma/fall. Pt indicates believes is from old/remote sports injury with periodic knee pain since. Denies prior knee surgery. No swelling to leg. No redness to skin. No fever or chills. Denies any hip or ankle pain. No radicular type pain. No extremity numbness/weakness. Pt reports trying otc meds for pain without significant relief.   The history is provided by the patient.  Knee Pain Associated symptoms: no fever        Past Medical History:  Diagnosis Date  . Anemia   . Knee pain, left    from old injury playing sports in high school,     There are no problems to display for this patient.   Past Surgical History:  Procedure Laterality Date  . CESAREAN SECTION  2007     OB History   No obstetric history on file.     No family history on file.  Social History   Tobacco Use  . Smoking status: Never Smoker  . Smokeless tobacco: Never Used  Substance Use Topics  . Alcohol use: No  . Drug use: No    Home Medications Prior to Admission medications   Medication Sig Start Date End Date Taking? Authorizing Provider  acetaminophen (TYLENOL) 325 MG tablet Take 650 mg by mouth every 6 (six) hours as needed for mild pain or headache.    [provider]  benzonatate (TESSALON) 100 MG capsule Take 1 capsule (100 mg total) by mouth every 8 (eight) hours. 07/25/19   Maudie Flakes, MD  HYDROcodone-acetaminophen (NORCO/VICODIN) 5-325 MG tablet Take 1 tablet by mouth every 6 (six) hours as needed for  moderate pain or severe pain. Patient not taking: Reported on 07/25/2019 09/24/18   Charlann Lange, PA-C  ibuprofen (ADVIL) 600 MG tablet Take 1 tablet (600 mg total) by mouth every 6 (six) hours as needed. Patient not taking: Reported on 07/25/2019 09/24/18   Charlann Lange, PA-C  methocarbamol (ROBAXIN) 500 MG tablet Take 1 tablet (500 mg total) by mouth 2 (two) times daily. Patient not taking: Reported on 07/25/2019 11/23/18   Tedd Sias, PA    Allergies    Penicillins  Review of Systems   Review of Systems  Constitutional: Negative for chills and fever.  Respiratory: Negative for shortness of breath.   Cardiovascular: Negative for chest pain and leg swelling.  Musculoskeletal:       Right knee pain  Skin: Negative for rash and wound.  Neurological: Negative for weakness and numbness.    Physical Exam Updated Vital Signs BP (!) 135/69 (BP Location: Right Arm)   Pulse 72   Temp 98.2 F (36.8 C) (Oral)   Resp 18   Wt 81.6 kg   LMP 10/21/2019   SpO2 99%   BMI 27.35 kg/m   Physical Exam Vitals and nursing note reviewed.  Constitutional:      Appearance: Normal appearance. She is well-developed.  HENT:     Head: Atraumatic.     Nose: Nose normal.     Mouth/Throat:     Mouth:  Mucous membranes are moist.  Eyes:     General: No scleral icterus.    Conjunctiva/sclera: Conjunctivae normal.  Neck:     Trachea: No tracheal deviation.  Cardiovascular:     Rate and Rhythm: Normal rate.     Pulses: Normal pulses.  Pulmonary:     Effort: Pulmonary effort is normal. No respiratory distress.  Genitourinary:    Comments: No cva tenderness.  Musculoskeletal:     Cervical back: Neck supple. No muscular tenderness.     Comments: Good passive rom right knee without pain. No effusion. Knee is stable. No leg erythema or swelling noted. Distal pulses palp.   Skin:    General: Skin is warm and dry.     Findings: No rash.  Neurological:     Mental Status: She is alert.     Comments:  Alert, speech normal. Motor/sens grossly intact. Steady gait.   Psychiatric:        Mood and Affect: Mood normal.     ED Results / Procedures / Treatments   Labs (all labs ordered are listed, but only abnormal results are displayed) Labs Reviewed - No data to display  EKG None  Radiology DG Knee Complete 4 Views Right  Result Date: 10/22/2019 CLINICAL DATA:  Right knee pain after injury today. EXAM: RIGHT KNEE - COMPLETE 4+ VIEW COMPARISON:  September 23, 2018. FINDINGS: No evidence of fracture, dislocation, or joint effusion. No evidence of arthropathy or other focal bone abnormality. Soft tissues are unremarkable. IMPRESSION: Negative. Electronically Signed   By: Marijo Conception M.D.   On: 10/22/2019 12:22    Procedures Procedures (including critical care time)  Medications Ordered in ED Medications - No data to display  ED Course  I have reviewed the triage vital signs and the nursing notes.  Pertinent labs & imaging results that were available during my care of the patient were reviewed by me and considered in my medical decision making (see chart for details).    MDM Rules/Calculators/A&P                         Xrays.  Reviewed nursing notes and prior charts for additional history.   Xrays reviewed/interpreted by me - no effusion, no fx.   As pt reports otc meds not helping, will give small quantity rx of ultram, and rec ortho f/u.  Return precautions provided.    Final Clinical Impression(s) / ED Diagnoses Final diagnoses:  None    Rx / DC Orders ED Discharge Orders    None       Lajean Saver, MD 10/22/19 1431

## 2019-10-22 NOTE — ED Triage Notes (Signed)
Pt in w/pain and swelling to R knee x few days. States she felt and heard a pop this morning, and pain got worse. Able to walk, painful to squat or straighten.

## 2019-12-07 NOTE — H&P (Signed)
PHI   43yof presents with c/o of pain in the right knee. The patient comes in for follow up MRI.  She was last seen on 08/27 and at that time was aspirated and injected; 20 cc were aspirated during this process.  She states that she continues to have pain mostly in the medial aspect of her knee.  MRI was obtained and showed a radial medial meniscus tear of the posterior horn.  She has not had any new falls or injuries.  She continues to walk with a limp.  No new concerns.     Past Medical History:  Diagnosis Date  . Anemia    . Knee pain, left      from old injury playing sports in high school,     Past Surgical History:  Procedure Laterality Date  . CESAREAN SECTION   2007                 OB History   No obstetric history on file.        No family history on file.   Social History        Tobacco Use  . Smoking status: Never Smoker  . Smokeless tobacco: Never Used  Substance Use Topics  . Alcohol use: No  . Drug use: No      Home Medications        Prior to Admission medications   Medication Sig Start Date End Date Taking? Authorizing Provider  acetaminophen (TYLENOL) 325 MG tablet Take 650 mg by mouth every 6 (six) hours as needed for mild pain or headache.       [provider]  benzonatate (TESSALON) 100 MG capsule Take 1 capsule (100 mg total) by mouth every 8 (eight) hours. 07/25/19     Maudie Flakes, MD  HYDROcodone-acetaminophen (NORCO/VICODIN) 5-325 MG tablet Take 1 tablet by mouth every 6 (six) hours as needed for moderate pain or severe pain. Patient not taking: Reported on 07/25/2019 09/24/18     Charlann Lange, PA-C  ibuprofen (ADVIL) 600 MG tablet Take 1 tablet (600 mg total) by mouth every 6 (six) hours as needed. Patient not taking: Reported on 07/25/2019 09/24/18     Charlann Lange, PA-C  methocarbamol (ROBAXIN) 500 MG tablet Take 1 tablet (500 mg total) by mouth 2 (two) times daily. Patient not taking: Reported on 07/25/2019 11/23/18     Tedd Sias,  PA      Allergies            Penicillins   Physical Exam  Gen: Well appearing female, NAD. Pulm: CTA b/l Card: RRR, No MRG Abd: Soft, NT, ND RLE: Inspection of the knee shows very mild effusion.  She has tenderness to palpation along the medial joint line, especially posteriorly.  She also has pain over the patellar tendon.  She is nontender over lateral joint line.  She has decreased range of motion 0-130 degrees, limited due to pain somewhat.  She has 5/5 strength with flexion and extension of the knee.  She is neurovascularly intact distally.  Negative Lachman's, negative posterior jerk, negative varus and valgus testing.    MRI Right Knee  shows radial tear of the posterior horn of the medial meniscus; otherwise relatively unremarkable MRI.  She does have an effusion and some inflammation of Hoffa's fat pad.    Assessment 43yof with Radial tear of the medial meniscus  Plan.    Patient with signs and symptoms most consistent with the meniscus tear  that was seen on MRI.  Discussed options with patient.  She would like to proceed with arthroscopic surgery for repair.  We discussed the surgery at length, and patient was scheduled for surgery today. Plan to follow up only if needed prior to surgery. Continue conservative management with relative rest for now.

## 2019-12-28 ENCOUNTER — Other Ambulatory Visit (HOSPITAL_COMMUNITY)
Admission: RE | Admit: 2019-12-28 | Discharge: 2019-12-28 | Disposition: A | Discharge status: Home / Self Care | Payer: Medicaid Other | Source: Ambulatory Visit | Attending: Orthopedic Surgery | Admitting: Orthopedic Surgery

## 2019-12-28 DIAGNOSIS — Z20822 Contact with and (suspected) exposure to covid-19: Secondary | ICD-10-CM | POA: Insufficient documentation

## 2019-12-28 DIAGNOSIS — Z01818 Encounter for other preprocedural examination: Secondary | ICD-10-CM | POA: Insufficient documentation

## 2019-12-28 LAB — SARS CORONAVIRUS 2 (TAT 6-24 HRS): SARS Coronavirus 2: NEGATIVE

## 2019-12-31 ENCOUNTER — Other Ambulatory Visit: Payer: Self-pay

## 2019-12-31 ENCOUNTER — Encounter (HOSPITAL_BASED_OUTPATIENT_CLINIC_OR_DEPARTMENT_OTHER): Payer: Self-pay | Admitting: Orthopedic Surgery

## 2019-12-31 NOTE — Progress Notes (Signed)
Spoke w/ via phone for pre-op interview---pt Lab needs dos----  Cbc, bmet, urine poct             Lab results------ekg 07-25-2019 epic, ekg 07-25-2019 epic COVID test ------12-28-2019 Arrive at -------767 am 01-01-2020 NPO after MN NO Solid Food.  Clear liquids from MN until---545 am then npo Medications to take morning of surgery -----none Diabetic medication -----n/a Patient Special Instructions -----none Pre-Op special Istructions -----none Patient verbalized understanding of instructions that were given at this phone interview. Patient denies shortness of breath, chest pain, fever, cough at this phone interview.

## 2019-12-31 NOTE — Anesthesia Preprocedure Evaluation (Addendum)
Anesthesia Evaluation  Patient identified by MRN, date of birth, ID band Patient awake    Reviewed: Allergy & Precautions, NPO status , Patient's Chart, lab work & pertinent test results  Airway Mallampati: I       Dental no notable dental hx.    Pulmonary neg pulmonary ROS,    Pulmonary exam normal        Cardiovascular hypertension, Pt. on medications Normal cardiovascular exam     Neuro/Psych negative neurological ROS  negative psych ROS   GI/Hepatic negative GI ROS, Neg liver ROS,   Endo/Other    Renal/GU negative Renal ROS  negative genitourinary   Musculoskeletal   Abdominal Normal abdominal exam  (+)   Peds  Hematology   Anesthesia Other Findings   Reproductive/Obstetrics                            Anesthesia Physical Anesthesia Plan  ASA: II  Anesthesia Plan: General   Post-op Pain Management:    Induction:   PONV Risk Score and Plan: 4 or greater and Ondansetron, Dexamethasone and Midazolam  Airway Management Planned: LMA  Additional Equipment: None  Intra-op Plan:   Post-operative Plan: Extubation in OR  Informed Consent: I have reviewed the patients History and Physical, chart, labs and discussed the procedure including the risks, benefits and alternatives for the proposed anesthesia with the patient or authorized representative who has indicated his/her understanding and acceptance.     Dental advisory given  Plan Discussed with: CRNA  Anesthesia Plan Comments:        Anesthesia Quick Evaluation

## 2020-01-01 ENCOUNTER — Ambulatory Visit (HOSPITAL_BASED_OUTPATIENT_CLINIC_OR_DEPARTMENT_OTHER)
Admission: RE | Admit: 2020-01-01 | Discharge: 2020-01-01 | Disposition: A | Discharge status: Home / Self Care | Payer: Medicaid Other | Attending: Orthopedic Surgery | Admitting: Orthopedic Surgery

## 2020-01-01 ENCOUNTER — Ambulatory Visit (HOSPITAL_BASED_OUTPATIENT_CLINIC_OR_DEPARTMENT_OTHER): Payer: Medicaid Other | Admitting: Anesthesiology

## 2020-01-01 ENCOUNTER — Encounter (HOSPITAL_BASED_OUTPATIENT_CLINIC_OR_DEPARTMENT_OTHER)
Admission: RE | Disposition: A | Discharge status: Home / Self Care | Payer: Self-pay | Source: Home / Self Care | Attending: Orthopedic Surgery

## 2020-01-01 ENCOUNTER — Encounter (HOSPITAL_BASED_OUTPATIENT_CLINIC_OR_DEPARTMENT_OTHER): Payer: Self-pay | Admitting: Orthopedic Surgery

## 2020-01-01 DIAGNOSIS — S83242A Other tear of medial meniscus, current injury, left knee, initial encounter: Secondary | ICD-10-CM | POA: Insufficient documentation

## 2020-01-01 DIAGNOSIS — Z880 Allergy status to penicillin: Secondary | ICD-10-CM | POA: Diagnosis not present

## 2020-01-01 DIAGNOSIS — X58XXXA Exposure to other specified factors, initial encounter: Secondary | ICD-10-CM | POA: Insufficient documentation

## 2020-01-01 HISTORY — DX: Unspecified dislocation of left knee, initial encounter: S83.105A

## 2020-01-01 HISTORY — DX: Essential (primary) hypertension: I10

## 2020-01-01 HISTORY — DX: Other tear of medial meniscus, current injury, left knee, initial encounter: S83.242A

## 2020-01-01 HISTORY — PX: KNEE ARTHROSCOPY WITH MEDIAL MENISECTOMY: SHX5651

## 2020-01-01 LAB — BASIC METABOLIC PANEL
Anion gap: 14 (ref 5–15)
BUN: 24 mg/dL — ABNORMAL HIGH (ref 6–20)
CO2: 24 mmol/L (ref 22–32)
Calcium: 9.4 mg/dL (ref 8.9–10.3)
Chloride: 105 mmol/L (ref 98–111)
Creatinine, Ser: 0.81 mg/dL (ref 0.44–1.00)
GFR, Estimated: >60 mL/min (ref >60)
Glucose, Bld: 99 mg/dL (ref 70–99)
Potassium: 3.7 mmol/L (ref 3.5–5.1)
Sodium: 143 mmol/L (ref 135–145)

## 2020-01-01 LAB — CBC
HCT: 36.6 % (ref 36.0–46.0)
Hemoglobin: 11 g/dL — ABNORMAL LOW (ref 12.0–15.0)
MCH: 20 pg — ABNORMAL LOW (ref 26.0–34.0)
MCHC: 30.1 g/dL (ref 30.0–36.0)
MCV: 66.5 fL — ABNORMAL LOW (ref 80.0–100.0)
Platelets: 284 10*3/uL (ref 150–400)
RBC: 5.5 MIL/uL — ABNORMAL HIGH (ref 3.87–5.11)
RDW: 16 % — ABNORMAL HIGH (ref 11.5–15.5)
WBC: 6.1 10*3/uL (ref 4.0–10.5)
nRBC: 0 % (ref 0.0–0.2)

## 2020-01-01 LAB — POCT PREGNANCY, URINE: Preg Test, Ur: NEGATIVE

## 2020-01-01 SURGERY — ARTHROSCOPY, KNEE, WITH MEDIAL MENISCECTOMY
Anesthesia: General | Site: Knee | Laterality: Right

## 2020-01-01 MED ORDER — DEXAMETHASONE SODIUM PHOSPHATE 10 MG/ML IJ SOLN
INTRAMUSCULAR | Status: DC | PRN
  Administered 2020-01-01: 10 mg via INTRAVENOUS

## 2020-01-01 MED ORDER — HYDROCODONE-ACETAMINOPHEN 5-325 MG PO TABS
1.0000 | ORAL_TABLET | ORAL | 0 refills | Status: AC | PRN
Start: 2020-01-01 — End: 2020-01-06

## 2020-01-01 MED ORDER — MEPERIDINE HCL 25 MG/ML IJ SOLN: 6.2500 mg | INTRAMUSCULAR | Status: DC | PRN

## 2020-01-01 MED ORDER — ACETAMINOPHEN 325 MG PO TABS: 325.0000 mg | ORAL_TABLET | ORAL | Status: DC | PRN

## 2020-01-01 MED ORDER — KETOROLAC TROMETHAMINE 30 MG/ML IJ SOLN: 30.0000 mg | Freq: Once | INTRAMUSCULAR | Status: DC | PRN

## 2020-01-01 MED ORDER — VANCOMYCIN HCL IN DEXTROSE 1-5 GM/200ML-% IV SOLN
INTRAVENOUS | Status: AC
  Filled 2020-01-01: qty 200

## 2020-01-01 MED ORDER — MIDAZOLAM HCL 2 MG/2ML IJ SOLN
INTRAMUSCULAR | Status: AC
  Filled 2020-01-01: qty 2

## 2020-01-01 MED ORDER — KETOROLAC TROMETHAMINE 30 MG/ML IJ SOLN
INTRAMUSCULAR | Status: DC | PRN
  Administered 2020-01-01: 30 mg via INTRAVENOUS

## 2020-01-01 MED ORDER — FENTANYL CITRATE (PF) 100 MCG/2ML IJ SOLN
INTRAMUSCULAR | Status: AC
  Filled 2020-01-01: qty 2

## 2020-01-01 MED ORDER — HYDROMORPHONE HCL 2 MG/ML IJ SOLN
INTRAMUSCULAR | Status: AC
  Filled 2020-01-01: qty 1

## 2020-01-01 MED ORDER — FENTANYL CITRATE (PF) 100 MCG/2ML IJ SOLN
INTRAMUSCULAR | Status: DC | PRN
  Administered 2020-01-01: 50 ug via INTRAVENOUS
  Administered 2020-01-01 (×2): 25 ug via INTRAVENOUS

## 2020-01-01 MED ORDER — ONDANSETRON HCL 4 MG/2ML IJ SOLN
INTRAMUSCULAR | Status: AC
  Filled 2020-01-01: qty 2

## 2020-01-01 MED ORDER — GLYCOPYRROLATE PF 0.2 MG/ML IJ SOSY
PREFILLED_SYRINGE | INTRAMUSCULAR | Status: AC
  Filled 2020-01-01: qty 1

## 2020-01-01 MED ORDER — OXYCODONE HCL 5 MG PO TABS: 5.0000 mg | ORAL_TABLET | Freq: Once | ORAL | Status: DC | PRN

## 2020-01-01 MED ORDER — DEXAMETHASONE SODIUM PHOSPHATE 10 MG/ML IJ SOLN
INTRAMUSCULAR | Status: AC
  Filled 2020-01-01: qty 1

## 2020-01-01 MED ORDER — HYDROMORPHONE HCL 1 MG/ML IJ SOLN
INTRAMUSCULAR | Status: DC | PRN
  Administered 2020-01-01: .5 mg via INTRAVENOUS

## 2020-01-01 MED ORDER — ACETAMINOPHEN 160 MG/5ML PO SOLN: 325.0000 mg | ORAL | Status: DC | PRN

## 2020-01-01 MED ORDER — METHYLPREDNISOLONE ACETATE 80 MG/ML IJ SUSP
INTRAMUSCULAR | Status: DC | PRN
  Administered 2020-01-01: 80 mg via INTRA_ARTICULAR

## 2020-01-01 MED ORDER — LIDOCAINE 2% (20 MG/ML) 5 ML SYRINGE
INTRAMUSCULAR | Status: AC
  Filled 2020-01-01: qty 5

## 2020-01-01 MED ORDER — ONDANSETRON HCL 4 MG/2ML IJ SOLN: 4.0000 mg | Freq: Once | INTRAMUSCULAR | Status: DC | PRN

## 2020-01-01 MED ORDER — ONDANSETRON HCL 4 MG/2ML IJ SOLN
INTRAMUSCULAR | Status: DC | PRN
  Administered 2020-01-01: 4 mg via INTRAVENOUS

## 2020-01-01 MED ORDER — MIDAZOLAM HCL 5 MG/5ML IJ SOLN
INTRAMUSCULAR | Status: DC | PRN
  Administered 2020-01-01: 2 mg via INTRAVENOUS

## 2020-01-01 MED ORDER — OXYCODONE HCL 5 MG/5ML PO SOLN: 5.0000 mg | Freq: Once | ORAL | Status: DC | PRN

## 2020-01-01 MED ORDER — BUPIVACAINE HCL 0.5 % IJ SOLN
INTRAMUSCULAR | Status: DC | PRN
  Administered 2020-01-01: 5 mL via INTRA_ARTICULAR

## 2020-01-01 MED ORDER — LIDOCAINE 2% (20 MG/ML) 5 ML SYRINGE
INTRAMUSCULAR | Status: DC | PRN
  Administered 2020-01-01: 100 mg via INTRAVENOUS

## 2020-01-01 MED ORDER — LACTATED RINGERS IV SOLN: INTRAVENOUS | Status: DC

## 2020-01-01 MED ORDER — VANCOMYCIN HCL IN DEXTROSE 1-5 GM/200ML-% IV SOLN
1000.0000 mg | INTRAVENOUS | Status: AC
  Administered 2020-01-01: 1000 mg via INTRAVENOUS

## 2020-01-01 MED ORDER — PROPOFOL 10 MG/ML IV BOLUS
INTRAVENOUS | Status: AC
  Filled 2020-01-01: qty 40

## 2020-01-01 MED ORDER — GLYCOPYRROLATE 0.2 MG/ML IJ SOLN
INTRAMUSCULAR | Status: DC | PRN
  Administered 2020-01-01: .1 mg via INTRAVENOUS

## 2020-01-01 MED ORDER — PROPOFOL 10 MG/ML IV BOLUS
INTRAVENOUS | Status: DC | PRN
  Administered 2020-01-01: 200 mg via INTRAVENOUS
  Administered 2020-01-01: 50 mg via INTRAVENOUS

## 2020-01-01 MED ORDER — SODIUM CHLORIDE 0.9 % IR SOLN
Status: DC | PRN
  Administered 2020-01-01: 6000 mL

## 2020-01-01 MED ORDER — KETOROLAC TROMETHAMINE 30 MG/ML IJ SOLN
INTRAMUSCULAR | Status: AC
  Filled 2020-01-01: qty 1

## 2020-01-01 MED ORDER — FENTANYL CITRATE (PF) 100 MCG/2ML IJ SOLN
25.0000 ug | INTRAMUSCULAR | Status: DC | PRN
  Administered 2020-01-01: 50 ug via INTRAVENOUS

## 2020-01-01 SURGICAL SUPPLY — 29 items
APL PRP STRL LF DISP 70% ISPRP (MISCELLANEOUS) ×1
BNDG ELASTIC 6X5.8 VLCR STR LF (GAUZE/BANDAGES/DRESSINGS) ×3 IMPLANT
CHLORAPREP W/TINT 26 (MISCELLANEOUS) ×3 IMPLANT
COVER WAND RF STERILE (DRAPES) ×3 IMPLANT
CUFF TOURN SGL QUICK 34 (TOURNIQUET CUFF) ×3
CUFF TRNQT CYL 34X4.125X (TOURNIQUET CUFF) ×1 IMPLANT
DISSECTOR  3.8MM X 13CM (MISCELLANEOUS) ×3
DISSECTOR 3.8MM X 13CM (MISCELLANEOUS) ×1 IMPLANT
DRAPE ARTHROSCOPY W/POUCH 114 (DRAPES) ×3 IMPLANT
DRAPE IMP U-DRAPE 54X76 (DRAPES) ×3 IMPLANT
DRAPE U-SHAPE 47X51 STRL (DRAPES) ×3 IMPLANT
DRSG EMULSION OIL 3X3 NADH (GAUZE/BANDAGES/DRESSINGS) ×3 IMPLANT
DRSG PAD ABDOMINAL 8X10 ST (GAUZE/BANDAGES/DRESSINGS) ×2 IMPLANT
EXCALIBUR 3.8MM X 13CM (MISCELLANEOUS) ×2 IMPLANT
GAUZE SPONGE 4X4 12PLY STRL (GAUZE/BANDAGES/DRESSINGS) ×3 IMPLANT
GLOVE BIO SURGEON STRL SZ7.5 (GLOVE) ×6 IMPLANT
GLOVE BIOGEL PI IND STRL 8 (GLOVE) ×2 IMPLANT
GLOVE BIOGEL PI INDICATOR 8 (GLOVE) ×4
GOWN STRL REUS W/TWL LRG LVL3 (GOWN DISPOSABLE) ×6 IMPLANT
IV NS IRRIG 3000ML ARTHROMATIC (IV SOLUTION) ×6 IMPLANT
MANIFOLD NEPTUNE II (INSTRUMENTS) ×3 IMPLANT
PACK ARTHROSCOPY DSU (CUSTOM PROCEDURE TRAY) ×3 IMPLANT
PACK BASIN DAY SURGERY FS (CUSTOM PROCEDURE TRAY) ×3 IMPLANT
PORT APPOLLO RF 90DEGREE MULTI (SURGICAL WAND) IMPLANT
SUT ETHILON 3 0 PS 1 (SUTURE) ×3 IMPLANT
TAPE CLOTH 3X10 TAN LF (GAUZE/BANDAGES/DRESSINGS) IMPLANT
TOWEL OR 17X26 10 PK STRL BLUE (TOWEL DISPOSABLE) ×3 IMPLANT
TUBING ARTHROSCOPY IRRIG 16FT (MISCELLANEOUS) ×3 IMPLANT
WATER STERILE IRR 1000ML POUR (IV SOLUTION) ×3 IMPLANT

## 2020-01-01 NOTE — Discharge Instructions (Signed)
Keep leg elevated with ice to reduce pain and swelling. You may increase pain medication up to 2 tablets every 4 hours for the first few days post op if needed.  Stop as needed pain medication as soon as you are able.  Diet: As you were doing prior to hospitalization   Dressing:  You may remove dressings and shower over incisions 3 days after surgery.  Place clean Band-Aid over incisions.  Continue to use ace wrap for compression to reduce swelling.   Activity:  Increase activity slowly as tolerated, but follow the weight bearing instructions below.  The rules on driving is that you can not be taking narcotics while you drive, and you must feel in control of the vehicle.    Weight Bearing: As tolerated   To prevent constipation: you may use a stool softener such as -  Colace (over the counter) 100 mg by mouth twice a day  Drink plenty of fluids (prune juice may be helpful) and high fiber foods Miralax (over the counter) for constipation as needed.    Itching:  If you experience itching with your medications, try taking only a single pain pill, or even half a pain pill at a time.  You can also use benadryl over the counter for itching or also to help with sleep.   Precautions:  If you experience chest pain or shortness of breath - call 911 immediately for transfer to the hospital emergency department!!  If you develop a fever greater that 101 F, purulent drainage from wound, increased redness or drainage from wound, or calf pain -- Call the office at [phone]                                                 Follow- Up Appointment:  Please call for an appointment to be seen in 2 weeks [place] - [phone]

## 2020-01-01 NOTE — Transfer of Care (Signed)
Immediate Anesthesia Transfer of Care Note  Patient: Nancy Davis  Procedure(s) Performed: KNEE ARTHROSCOPY WITH MEDIAL MENISECTOMY and chrondroplasty (Right Knee)  Patient Location: PACU  Anesthesia Type:General  Level of Consciousness: drowsy and patient cooperative  Airway & Oxygen Therapy: Patient Spontanous Breathing and Patient connected to face mask oxygen  Post-op Assessment: Report given to RN and Post -op Vital signs reviewed and stable  Post vital signs: Reviewed and stable  Last Vitals:  Vitals Value Taken Time  BP 123/73 01/01/20 0933  Temp    Pulse 94 01/01/20 0936  Resp 24 01/01/20 0936  SpO2 98 % 01/01/20 0936  Vitals shown include unvalidated device data.  Last Pain:  Vitals:   01/01/20 0631  TempSrc: Oral  PainSc: 0-No pain      Patients Stated Pain Goal: 4 (77/11/65 7903)  Complications: No complications documented.

## 2020-01-01 NOTE — Interval H&P Note (Signed)
History and Physical Interval Note:  01/01/2020 6:21 AM  Nancy Davis  has presented today for surgery, with the diagnosis of LEFT KNEE MEDIAL MENISCUS TEAR.  The various methods of treatment have been discussed with the patient and family. After consideration of risks, benefits and other options for treatment, the patient has consented to  Procedure(s): KNEE ARTHROSCOPY WITH MEDIAL MENISECTOMY (Left) as a surgical intervention.  The patient's history has been reviewed, patient examined, no change in status, stable for surgery.  I have reviewed the patient's chart and labs.  Questions were answered to the patient's satisfaction.     Renette Butters

## 2020-01-01 NOTE — Anesthesia Postprocedure Evaluation (Signed)
Anesthesia Post Note  Patient: Nancy Davis  Procedure(s) Performed: KNEE ARTHROSCOPY WITH MEDIAL MENISECTOMY and chrondroplasty (Right Knee)     Patient location during evaluation: PACU Anesthesia Type: General Level of consciousness: awake and sedated Pain management: pain level controlled Vital Signs Assessment: post-procedure vital signs reviewed and stable Respiratory status: spontaneous breathing Cardiovascular status: stable Postop Assessment: no apparent nausea or vomiting Anesthetic complications: no   No complications documented.  Last Vitals:  Vitals:   01/01/20 1015 01/01/20 1030  BP: 138/85 134/74  Pulse: (!) 59 62  Resp: 15 17  Temp:  36.5 C  SpO2: 94% 96%    Last Pain:  Vitals:   01/01/20 1030  TempSrc:   PainSc: 0-No pain   Pain Goal: Patients Stated Pain Goal: 4 (01/01/20 1015)                 Huston Foley

## 2020-01-01 NOTE — Anesthesia Procedure Notes (Signed)
Procedure Name: LMA Insertion Date/Time: 01/01/2020 8:51 AM Performed by: Gwyndolyn Saxon, CRNA Pre-anesthesia Checklist: Patient identified, Emergency Drugs available, Suction available and Patient being monitored Patient Re-evaluated:Patient Re-evaluated prior to induction Oxygen Delivery Method: Circle system utilized Preoxygenation: Pre-oxygenation with 100% oxygen Induction Type: IV induction Ventilation: Mask ventilation without difficulty LMA: LMA inserted LMA Size: 4.0 Number of attempts: 1 Placement Confirmation: positive ETCO2 and breath sounds checked- equal and bilateral Tube secured with: Tape Dental Injury: Teeth and Oropharynx as per pre-operative assessment

## 2020-01-01 NOTE — Progress Notes (Signed)
Ambulated short distance to BR and voided, moved to Phase 2 via W/C and placed in Recliner, given ginger ale and crackers, mother in room

## 2020-01-01 NOTE — Op Note (Signed)
01/01/2020  9:44 AM  PATIENT:  Nancy Davis    PRE-OPERATIVE DIAGNOSIS:  LEFT KNEE MEDIAL MENISCUS TEAR  POST-OPERATIVE DIAGNOSIS:  Same  PROCEDURE:  KNEE ARTHROSCOPY WITH MEDIAL MENISECTOMY and chrondroplasty  SURGEON:  Renette Butters, MD  ASSISTANT: Margy Clarks, PA-C, he was present and scrubbed throughout the case, critical for completion in a timely fashion, and for retraction, instrumentation, and closure.   ANESTHESIA:   General  BLOOD LOSS: min  COMPLICATIONS: None   PREOPERATIVE INDICATIONS:  AHTZIRY SAATHOFF is a  44 y.o. female with a diagnosis of [place] who failed conservative measures and elected for surgical management.    The risks benefits and alternatives were discussed with the patient preoperatively including but not limited to the risks of infection, bleeding, nerve injury, cardiopulmonary complications, the need for revision surgery, among others, and the patient was willing to proceed.  OPERATIVE IMPLANTS: none  OPERATIVE FINDINGS: Examination under anesthesia: stable Diagnostic Arthroscopy:  articular cartilage:grade 2 PF Medial meniscus:complex tear Lateral meniscus:small tear Anterior cruciate ligament/PCL: stable Loose bodies: none    OPERATIVE PROCEDURE:  Patient was identified in the preoperative holding area and site was marked by me female was transported to the operating theater and placed on the table in supine position taking care to pad all bony prominences. After a preincinduction time out anesthesia was induced.  female received vanc for preoperative antibiotics. The left lower extremity was prepped and draped in normal sterile fashion and a pre-incision timeout was performed.   A small stab incision was made in the anterolateral portal position. The arthroscope was introduced in the joint. A medial portal was then established under direct visualization just above the anterior horn of the medial meniscus.  Diagnostic arthroscopy was then carried out with findings as described above.  I debrided the medial and lateral meniscal tears with a biter and shaver.  I then performed a patellofemoral chondroplasty with a  Shaver.   The arthroscopic equipment was removed from the joint and the portals were closed with 3-0 nylon in an interrupted fashion. The knee was infiltrated with depomedrol 80mg .  Sterile dressings were then applied including Xeroform 4 x 4's ABDs an ACE bandage.  The patient was then allowed to awaken from general anesthesia, transferred to the stretcher and taken to the recovery room in stable condition.  POSTOPERATIVE PLAN: The patient will be discharged home today and will followup in one week for suture removal and wound check.  VTE prophylaxis: mobilze and ASA

## 2020-01-02 ENCOUNTER — Encounter (HOSPITAL_BASED_OUTPATIENT_CLINIC_OR_DEPARTMENT_OTHER): Payer: Self-pay | Admitting: Orthopedic Surgery

## 2020-06-20 ENCOUNTER — Emergency Department (HOSPITAL_COMMUNITY): Payer: Medicaid Other

## 2020-06-20 ENCOUNTER — Other Ambulatory Visit: Payer: Self-pay

## 2020-06-20 ENCOUNTER — Encounter (HOSPITAL_COMMUNITY): Payer: Self-pay

## 2020-06-20 ENCOUNTER — Emergency Department (HOSPITAL_COMMUNITY)
Admission: EM | Admit: 2020-06-20 | Discharge: 2020-06-20 | Disposition: A | Discharge status: Home / Self Care | Payer: Medicaid Other | Attending: Emergency Medicine | Admitting: Emergency Medicine

## 2020-06-20 DIAGNOSIS — W19XXXA Unspecified fall, initial encounter: Secondary | ICD-10-CM

## 2020-06-20 DIAGNOSIS — W109XXA Fall (on) (from) unspecified stairs and steps, initial encounter: Secondary | ICD-10-CM | POA: Diagnosis not present

## 2020-06-20 DIAGNOSIS — Y9301 Activity, walking, marching and hiking: Secondary | ICD-10-CM | POA: Diagnosis not present

## 2020-06-20 DIAGNOSIS — M25561 Pain in right knee: Secondary | ICD-10-CM | POA: Diagnosis not present

## 2020-06-20 DIAGNOSIS — M25571 Pain in right ankle and joints of right foot: Secondary | ICD-10-CM | POA: Diagnosis not present

## 2020-06-20 DIAGNOSIS — I10 Essential (primary) hypertension: Secondary | ICD-10-CM | POA: Insufficient documentation

## 2020-06-20 MED ORDER — HYDROCODONE-ACETAMINOPHEN 5-325 MG PO TABS
1.0000 | ORAL_TABLET | Freq: Once | ORAL | Status: AC
  Administered 2020-06-20: 1 via ORAL
  Filled 2020-06-20: qty 1

## 2020-06-20 MED ORDER — DICLOFENAC SODIUM 1 % EX GEL: 2.0000 g | Freq: Four times a day (QID) | CUTANEOUS | 0 refills | Status: DC | PRN

## 2020-06-20 MED ORDER — IBUPROFEN 800 MG PO TABS: 800.0000 mg | ORAL_TABLET | Freq: Three times a day (TID) | ORAL | 0 refills | Status: DC | PRN

## 2020-06-20 NOTE — Progress Notes (Signed)
Orthopedic Tech Progress Note Patient Details:  Nancy Davis September 30, 1975 [id number]  Ortho Devices Type of Ortho Device: Crutches Ortho Device/Splint Interventions: Adjustment,Application,Ordered   Post Interventions Patient Tolerated: Well Instructions Provided: Adjustment of device   Jarid Sasso A Margrit Minner 06/20/2020, 2:55 PM

## 2020-06-20 NOTE — Discharge Instructions (Signed)
Were seen in the emergency department today with knee pain after a fall.  Your x-rays did not show any broken bones.  We are applying an Ace wrap and will give you crutches to use for comfort.  You may put weight on the leg as tolerated.  Please use Tylenol and/or Motrin as needed for pain and I have also called in a pain relieving gel that you can rub on the knee.  We discussed ice and compression along with plan for PCP and orthopedics follow-up should symptoms persist.

## 2020-06-20 NOTE — ED Provider Notes (Signed)
MSE was initiated and I personally evaluated the patient and placed orders (if any) at  12:34 PM on June 20, 2020.  The patient appears stable so that the remainder of the MSE may be completed by another provider.  Patient tripped and fell yesterday.  She landed on her right knee, she was ambulatory after but now has pain in her right knee and right ankle.  Didn't strike her head or pass out.   Pain medication and x-rays ordered.    Patient counseled on the provider in triage process, the need to stay for full evaluation and possible bad outcomes of failing to do so.     Lorin Glass, PA-C 06/20/20 1238    Elnora Morrison, MD 06/21/20 1009

## 2020-06-20 NOTE — ED Triage Notes (Signed)
PT tripped and fell yesterday on her right knee. Had surgery on right knee in October. Woke up this morning to swollen knee, stabbing pain.

## 2020-06-20 NOTE — ED Provider Notes (Signed)
Emergency Department Provider Note   I have reviewed the triage vital signs and the nursing notes.   HISTORY  Chief Complaint Knee Pain   HPI Nancy Davis is a 45 y.o. female with PMH of HTN presents to the ED after fall with right knee pain. Patient was walking down the stairs when she lost her footing and landed on the right knee. Denies any knee buckling, bending, or deformity to suspect dislocation. Some more mild pain in the right ankle. No hip pain. No HA. No neck pain, numbness, or weakness.    Past Medical History:  Diagnosis Date  . Anemia    yrs ago  . Dislocation of left knee with medial meniscus tear   . Hypertension   . Knee pain, left    from old injury playing sports in high school,     There are no problems to display for this patient.   Past Surgical History:  Procedure Laterality Date  . CESAREAN SECTION  2007  . KNEE ARTHROSCOPY WITH MEDIAL MENISECTOMY Right 01/01/2020   Procedure: KNEE ARTHROSCOPY WITH MEDIAL MENISECTOMY and chrondroplasty;  Surgeon: Renette Butters, MD;  Location: [facility];  Service: Orthopedics;  Laterality: Right;    Allergies Penicillins and Coconut oil  History reviewed. No pertinent family history.  Social History Social History   Tobacco Use  . Smoking status: Never Smoker  . Smokeless tobacco: Never Used  Vaping Use  . Vaping Use: Never used  Substance Use Topics  . Alcohol use: No  . Drug use: No    Review of Systems  Constitutional: No fever/chills Cardiovascular: Denies chest pain. Respiratory: Denies shortness of breath. Gastrointestinal: No abdominal pain. Musculoskeletal: Negative for back pain. Positive leg/knee pain.  Skin: Negative for rash. Neurological: Negative for numbness.  10-point ROS otherwise negative.  ____________________________________________   PHYSICAL EXAM:  VITAL SIGNS: ED Triage Vitals  Enc Vitals Group     BP 06/20/20 1220 (!) 173/103     Pulse  Rate 06/20/20 1220 79     Resp 06/20/20 1220 20     Temp 06/20/20 1220 98.4 F (36.9 C)     Temp Source 06/20/20 1220 Oral     SpO2 06/20/20 1220 100 %   Constitutional: Alert and oriented. Well appearing and in no acute distress. Eyes: Conjunctivae are normal.  Head: Atraumatic. Nose: No congestion/rhinnorhea. Mouth/Throat: Mucous membranes are moist. Neck: No stridor.   Cardiovascular: Good peripheral circulation. Respiratory: Normal respiratory effort.  Gastrointestinal: No distention.  Musculoskeletal: Mild right knee effusion without bruising. No laceration. No abrasion. Normal ROM of the right knee and ankle. No proximal fibular tenderness.  Neurologic:  Normal speech and language. Normal strength and sensation in the right lower leg.  Skin:  Skin is warm, dry and intact. No rash noted.   ____________________________________________  [id number]  DG Ankle Complete Right  Result Date: 06/20/2020 CLINICAL DATA:  Fall yesterday with pain and swelling. EXAM: RIGHT ANKLE - COMPLETE 3+ VIEW COMPARISON:  None. FINDINGS: Negative for fracture or dislocation in the right ankle. Alignment of the ankle is normal. There appears to be diffuse soft tissue swelling throughout the ankle particularly along the medial aspect. IMPRESSION: Soft tissue swelling in the right ankle without acute bone abnormality. Electronically Signed   By: Markus Daft M.D.   On: 06/20/2020 13:22   DG Knee Complete 4 Views Right  Result Date: 06/20/2020 CLINICAL DATA:  45 year old female with pain and swelling after fall. EXAM: RIGHT KNEE -  COMPLETE 4+ VIEW COMPARISON:  10/22/2019 FINDINGS: No evidence of fracture, dislocation, or joint effusion. No evidence of arthropathy or other focal bone abnormality. Soft tissues are unremarkable. IMPRESSION: No acute fracture or malalignment. Electronically Signed   By: Ruthann Cancer MD   On: 06/20/2020 13:23     ____________________________________________   PROCEDURES  Procedure(s) performed:   Procedures  None  ____________________________________________   INITIAL IMPRESSION / ASSESSMENT AND PLAN / ED COURSE  Pertinent labs & imaging results that were available during my care of the patient were reviewed by me and considered in my medical decision making (see chart for details).   Patient presents to the emergency department for evaluation of pain in the right knee and ankle.  There is no acute bony abnormality on ankle or knee plain films.  The mechanism and patient's description does not sound like knee dislocation/relocation.  She has intact pulses in the foot and normal sensation.  Plan for Ace wrap, crutches for comfort, NSAIDs, and RICE therapy. Provided contact information for ortho.    ____________________________________________  FINAL CLINICAL IMPRESSION(S) / ED DIAGNOSES  Final diagnoses:  Acute pain of right knee  Fall, initial encounter     MEDICATIONS GIVEN DURING THIS VISIT:  Medications  HYDROcodone-acetaminophen (NORCO/VICODIN) 5-325 MG per tablet 1 tablet (1 tablet Oral Given 06/20/20 1425)     NEW OUTPATIENT MEDICATIONS STARTED DURING THIS VISIT:  Discharge Medication List as of 06/20/2020  2:24 PM    START taking these medications   Details  diclofenac Sodium (VOLTAREN) 1 % GEL Apply 2 g topically 4 (four) times daily as needed (knee pain)., Starting Fri 06/20/2020, Normal    ibuprofen (ADVIL) 800 MG tablet Take 1 tablet (800 mg total) by mouth every 8 (eight) hours as needed for moderate pain., Starting Fri 06/20/2020, Normal        Note:  This document was prepared using Dragon voice recognition software and may include unintentional dictation errors.  Nanda Quinton, MD, [facility] Emergency Medicine    Ondine Gemme, W[facility], MD 06/20/20 1537

## 2020-07-13 ENCOUNTER — Other Ambulatory Visit: Payer: Self-pay

## 2020-07-13 ENCOUNTER — Encounter (HOSPITAL_COMMUNITY): Payer: Self-pay | Admitting: Emergency Medicine

## 2020-07-13 ENCOUNTER — Emergency Department (HOSPITAL_COMMUNITY): Payer: Medicaid Other

## 2020-07-13 ENCOUNTER — Observation Stay (HOSPITAL_COMMUNITY)
Admission: EM | Admit: 2020-07-13 | Discharge: 2020-07-14 | Disposition: A | Discharge status: Home / Self Care | Payer: Medicaid Other | Attending: Internal Medicine | Admitting: Internal Medicine

## 2020-07-13 DIAGNOSIS — R079 Chest pain, unspecified: Secondary | ICD-10-CM | POA: Diagnosis present

## 2020-07-13 DIAGNOSIS — I110 Hypertensive heart disease with heart failure: Secondary | ICD-10-CM | POA: Diagnosis not present

## 2020-07-13 DIAGNOSIS — Z20822 Contact with and (suspected) exposure to covid-19: Secondary | ICD-10-CM | POA: Insufficient documentation

## 2020-07-13 DIAGNOSIS — I5030 Unspecified diastolic (congestive) heart failure: Secondary | ICD-10-CM | POA: Diagnosis not present

## 2020-07-13 DIAGNOSIS — E860 Dehydration: Secondary | ICD-10-CM | POA: Insufficient documentation

## 2020-07-13 DIAGNOSIS — I10 Essential (primary) hypertension: Secondary | ICD-10-CM | POA: Diagnosis present

## 2020-07-13 DIAGNOSIS — R000 Tachycardia, unspecified: Secondary | ICD-10-CM | POA: Diagnosis present

## 2020-07-13 DIAGNOSIS — R718 Other abnormality of red blood cells: Secondary | ICD-10-CM | POA: Diagnosis present

## 2020-07-13 DIAGNOSIS — R072 Precordial pain: Principal | ICD-10-CM | POA: Insufficient documentation

## 2020-07-13 DIAGNOSIS — R778 Other specified abnormalities of plasma proteins: Secondary | ICD-10-CM

## 2020-07-13 DIAGNOSIS — Z79899 Other long term (current) drug therapy: Secondary | ICD-10-CM | POA: Insufficient documentation

## 2020-07-13 DIAGNOSIS — R009 Unspecified abnormalities of heart beat: Secondary | ICD-10-CM

## 2020-07-13 NOTE — ED Triage Notes (Addendum)
Patient arrives complaining of a sudden onset of stabbing middle chest pain. Patient endorses cardiac history. Patient noted to be tearful and diaphoretic, HR noted to be 185 in triage.

## 2020-07-14 ENCOUNTER — Observation Stay (INDEPENDENT_AMBULATORY_CARE_PROVIDER_SITE_OTHER): Payer: Medicaid Other

## 2020-07-14 ENCOUNTER — Observation Stay (HOSPITAL_BASED_OUTPATIENT_CLINIC_OR_DEPARTMENT_OTHER): Payer: Medicaid Other

## 2020-07-14 ENCOUNTER — Encounter (HOSPITAL_COMMUNITY): Payer: Self-pay

## 2020-07-14 ENCOUNTER — Emergency Department (HOSPITAL_COMMUNITY): Payer: Medicaid Other

## 2020-07-14 ENCOUNTER — Other Ambulatory Visit: Payer: Self-pay | Admitting: Physician Assistant

## 2020-07-14 ENCOUNTER — Encounter: Payer: Self-pay | Admitting: Radiology

## 2020-07-14 DIAGNOSIS — R009 Unspecified abnormalities of heart beat: Secondary | ICD-10-CM | POA: Diagnosis not present

## 2020-07-14 DIAGNOSIS — R778 Other specified abnormalities of plasma proteins: Secondary | ICD-10-CM | POA: Diagnosis not present

## 2020-07-14 DIAGNOSIS — R030 Elevated blood-pressure reading, without diagnosis of hypertension: Secondary | ICD-10-CM

## 2020-07-14 DIAGNOSIS — R000 Tachycardia, unspecified: Secondary | ICD-10-CM

## 2020-07-14 DIAGNOSIS — R079 Chest pain, unspecified: Secondary | ICD-10-CM | POA: Diagnosis present

## 2020-07-14 DIAGNOSIS — R002 Palpitations: Secondary | ICD-10-CM

## 2020-07-14 DIAGNOSIS — I361 Nonrheumatic tricuspid (valve) insufficiency: Secondary | ICD-10-CM

## 2020-07-14 DIAGNOSIS — R7989 Other specified abnormal findings of blood chemistry: Secondary | ICD-10-CM | POA: Insufficient documentation

## 2020-07-14 DIAGNOSIS — R718 Other abnormality of red blood cells: Secondary | ICD-10-CM | POA: Diagnosis present

## 2020-07-14 DIAGNOSIS — I10 Essential (primary) hypertension: Secondary | ICD-10-CM | POA: Diagnosis present

## 2020-07-14 LAB — CBC WITH DIFFERENTIAL/PLATELET
Abs Immature Granulocytes: NONE SEEN 10*3/uL (ref 0.00–0.07)
Band Neutrophils: 0 %
Basophils Relative: 2 %
Blasts: NONE SEEN %
Eosinophils Relative: 0 %
HCT: 42.8 % (ref 36.0–46.0)
Hemoglobin: 13.5 g/dL (ref 12.0–15.0)
Immature Granulocytes: NONE SEEN %
Lymphocytes Relative: 63 %
MCH: 20.9 pg — ABNORMAL LOW (ref 26.0–34.0)
MCHC: 31.5 g/dL (ref 30.0–36.0)
MCV: 66.2 fL — ABNORMAL LOW (ref 80.0–100.0)
Metamyelocytes Relative: NONE SEEN %
Monocytes Relative: 9 %
Myelocytes: NONE SEEN %
Neutrophils Relative %: 26 %
Platelets: 345 10*3/uL (ref 150–400)
Promyelocytes Relative: NONE SEEN %
RBC Morphology: NORMAL
RBC: 6.47 MIL/uL — ABNORMAL HIGH (ref 3.87–5.11)
RDW: 17.8 % — ABNORMAL HIGH (ref 11.5–15.5)
WBC Morphology: NORMAL
WBC: 8.1 10*3/uL (ref 4.0–10.5)
nRBC: 0 % (ref 0.0–0.2)
nRBC: NONE SEEN /100 WBC

## 2020-07-14 LAB — ECHOCARDIOGRAM COMPLETE
Area-P 1/2: 2.91 cm2
Calc EF: 51.8 %
Height: 68 in
S' Lateral: 3 cm
Single Plane A2C EF: 52.6 %
Single Plane A4C EF: 50.6 %
Weight: 3040 oz

## 2020-07-14 LAB — COMPREHENSIVE METABOLIC PANEL
ALT: 17 U/L (ref 0–44)
AST: 27 U/L (ref 15–41)
Albumin: 3.7 g/dL (ref 3.5–5.0)
Alkaline Phosphatase: 70 U/L (ref 38–126)
Anion gap: 9 (ref 5–15)
BUN: 24 mg/dL — ABNORMAL HIGH (ref 6–20)
CO2: 25 mmol/L (ref 22–32)
Calcium: 9.6 mg/dL (ref 8.9–10.3)
Chloride: 105 mmol/L (ref 98–111)
Creatinine, Ser: 1.1 mg/dL — ABNORMAL HIGH (ref 0.44–1.00)
GFR, Estimated: >60 mL/min (ref >60)
Glucose, Bld: 122 mg/dL — ABNORMAL HIGH (ref 70–99)
Potassium: 4.2 mmol/L (ref 3.5–5.1)
Sodium: 139 mmol/L (ref 135–145)
Total Bilirubin: 0.4 mg/dL (ref 0.3–1.2)
Total Protein: 8.6 g/dL — ABNORMAL HIGH (ref 6.5–8.1)

## 2020-07-14 LAB — LIPID PANEL
Cholesterol: 191 mg/dL (ref 0–200)
HDL: 48 mg/dL (ref >40)
LDL Cholesterol: 128 mg/dL — ABNORMAL HIGH (ref 0–99)
Total CHOL/HDL Ratio: 4 RATIO
Triglycerides: 75 mg/dL (ref <150)
VLDL: 15 mg/dL (ref 0–40)

## 2020-07-14 LAB — T4, FREE: Free T4: 0.83 ng/dL (ref 0.61–1.12)

## 2020-07-14 LAB — IRON AND TIBC
Iron: 40 ug/dL (ref 28–170)
Saturation Ratios: 9 % — ABNORMAL LOW (ref 10.4–31.8)
TIBC: 447 ug/dL (ref 250–450)
UIBC: 407 ug/dL

## 2020-07-14 LAB — MAGNESIUM: Magnesium: 1.8 mg/dL (ref 1.7–2.4)

## 2020-07-14 LAB — CORTISOL: Cortisol, Plasma: 9.5 ug/dL

## 2020-07-14 LAB — RETICULOCYTES
Immature Retic Fract: 10.5 % (ref 2.3–15.9)
RBC.: 6.1 MIL/uL — ABNORMAL HIGH (ref 3.87–5.11)
Retic Count, Absolute: 62.8 10*3/uL (ref 19.0–186.0)
Retic Ct Pct: 1 % (ref 0.4–3.1)

## 2020-07-14 LAB — FERRITIN: Ferritin: 10 ng/mL — ABNORMAL LOW (ref 11–307)

## 2020-07-14 LAB — I-STAT BETA HCG BLOOD, ED (MC, WL, AP ONLY): I-stat hCG, quantitative: <5 m[IU]/mL (ref <5)

## 2020-07-14 LAB — TSH: TSH: 2.38 u[IU]/mL (ref 0.350–4.500)

## 2020-07-14 LAB — RESP PANEL BY RT-PCR (FLU A&B, COVID) ARPGX2
Influenza A by PCR: NEGATIVE
Influenza B by PCR: NEGATIVE
SARS Coronavirus 2 by RT PCR: NEGATIVE

## 2020-07-14 LAB — HEMOGLOBIN A1C
Hgb A1c MFr Bld: 6 % — ABNORMAL HIGH (ref 4.8–5.6)
Mean Plasma Glucose: 125.5 mg/dL

## 2020-07-14 LAB — TROPONIN I (HIGH SENSITIVITY)
Troponin I (High Sensitivity): 11 ng/L (ref <18)
Troponin I (High Sensitivity): 50 ng/L — ABNORMAL HIGH (ref <18)

## 2020-07-14 MED ORDER — ASPIRIN 81 MG PO CHEW
324.0000 mg | CHEWABLE_TABLET | Freq: Once | ORAL | Status: AC
  Administered 2020-07-14: 324 mg via ORAL
  Filled 2020-07-14: qty 4

## 2020-07-14 MED ORDER — METOPROLOL TARTRATE 5 MG/5ML IV SOLN: 5.0000 mg | Freq: Four times a day (QID) | INTRAVENOUS | Status: DC | PRN

## 2020-07-14 MED ORDER — ACETAMINOPHEN 325 MG PO TABS: 650.0000 mg | ORAL_TABLET | Freq: Four times a day (QID) | ORAL | Status: DC | PRN

## 2020-07-14 MED ORDER — METOPROLOL SUCCINATE ER 50 MG PO TB24: 25.0000 mg | ORAL_TABLET | Freq: Every day | ORAL | Status: DC

## 2020-07-14 MED ORDER — ACETAMINOPHEN 650 MG RE SUPP: 650.0000 mg | Freq: Four times a day (QID) | RECTAL | Status: DC | PRN

## 2020-07-14 MED ORDER — MAGNESIUM SULFATE 2 GM/50ML IV SOLN
2.0000 g | Freq: Once | INTRAVENOUS | Status: AC
  Administered 2020-07-14: 2 g via INTRAVENOUS
  Filled 2020-07-14: qty 50

## 2020-07-14 MED ORDER — SODIUM CHLORIDE 0.9 % IV BOLUS
500.0000 mL | Freq: Once | INTRAVENOUS | Status: AC
  Administered 2020-07-13: 500 mL via INTRAVENOUS

## 2020-07-14 MED ORDER — ENOXAPARIN SODIUM 40 MG/0.4ML ~~LOC~~ SOLN: 40.0000 mg | SUBCUTANEOUS | Status: DC

## 2020-07-14 MED ORDER — METOPROLOL TARTRATE 25 MG PO TABS
25.0000 mg | ORAL_TABLET | Freq: Two times a day (BID) | ORAL | Status: DC
  Administered 2020-07-14: 25 mg via ORAL
  Filled 2020-07-14: qty 1

## 2020-07-14 MED ORDER — METOPROLOL TARTRATE 25 MG PO TABS
25.0000 mg | ORAL_TABLET | Freq: Once | ORAL | Status: AC
  Administered 2020-07-14: 25 mg via ORAL
  Filled 2020-07-14: qty 1

## 2020-07-14 MED ORDER — METOPROLOL SUCCINATE ER 25 MG PO TB24: 25.0000 mg | ORAL_TABLET | Freq: Every day | ORAL | 1 refills | Status: DC

## 2020-07-14 MED ORDER — SODIUM CHLORIDE 0.9 % IV SOLN: INTRAVENOUS | Status: DC

## 2020-07-14 MED ORDER — ONDANSETRON HCL 4 MG PO TABS: 4.0000 mg | ORAL_TABLET | Freq: Four times a day (QID) | ORAL | Status: DC | PRN

## 2020-07-14 MED ORDER — ONDANSETRON HCL 4 MG/2ML IJ SOLN: 4.0000 mg | Freq: Four times a day (QID) | INTRAMUSCULAR | Status: DC | PRN

## 2020-07-14 MED ORDER — IOHEXOL 350 MG/ML SOLN
100.0000 mL | Freq: Once | INTRAVENOUS | Status: AC | PRN
  Administered 2020-07-14: 100 mL via INTRAVENOUS

## 2020-07-14 MED ORDER — ENOXAPARIN SODIUM 40 MG/0.4ML ~~LOC~~ SOLN
40.0000 mg | SUBCUTANEOUS | Status: DC
  Administered 2020-07-14: 40 mg via SUBCUTANEOUS
  Filled 2020-07-14: qty 0.4

## 2020-07-14 MED ORDER — LISINOPRIL-HYDROCHLOROTHIAZIDE 10-12.5 MG PO TABS: 1.0000 | ORAL_TABLET | Freq: Every day | ORAL | 1 refills | Status: DC

## 2020-07-14 NOTE — Consult Note (Addendum)
Cardiology Consultation:   Patient ID: SHAQUEENA MAUCERI MRN: [id number]; DOB: [birth year]  Admit date: 07/13/2020 Date of Consult: 07/14/2020  PCP:  Default, Provider, MD   [place]  Cardiologist:  Pixie Casino, MD new   Patient Profile:   Nancy Davis is a 45 y.o. female with a hx of HTN, anemia, an dleft knee dislocation and meniscus tear who is being seen today for the evaluation of chest pain and tachycardia at the request of D.r McClung.  History of Present Illness:   Nancy Davis has no prior cardiac history. She has treated her HTN I the past with ACEI and HCTZ, no longer taking. She does not have a history of ischemic heart disease, MI, or stroke.   He presented to [facility] with sharp stabbing chest pain and tachycardia with HR in the 180s. She states that she took ACEI and HCTZ until they ran out and never refilled them. She repeatedly states that she "ignores my health."  She reports doing laundry and climbing stairs in her house yesterday and developed sharp stabbing pain that forced her to rest. This was associated with diaphoresis, nausea, vomiting, and shortness of breath. She thinks her heart was also racing. She rested and then tried to watch TV. About 10pm while watching television, she had another bout of sharp stabbing chest pain and decided to be evaluated. She arrived by private vehicle. In triage, noted to have HR in the 180s. She was instructed to bear down like she is having a bowel movement. After this vagal maneuver, HR reduced to hte 100s and she had resolution of her chest pain. No recurrence of chest pain since.   She also reports an episode of CP about 1 week ago at work. This was associated with diaphoresis, nausea, and vomiting. This lasted for about 1 hour. She is fairly sedentary. She can walk the stairs in her apartment, but does note significant dypsnea. She does not exercise. She also reports intermittent lower extremity swelling.  She is mindful of salt and rarely eats restaurant food. She lives at home with her 43 yo daughter, who helps with house work. She reports heart disease in her mother and HTN in her father. She also reports her grandmother died because her body "rejected the pacemaker."     Past Medical History:  Diagnosis Date  . Anemia    yrs ago  . Dislocation of left knee with medial meniscus tear   . Hypertension   . Knee pain, left    from old injury playing sports in high school,     Past Surgical History:  Procedure Laterality Date  . CESAREAN SECTION  2007  . KNEE ARTHROSCOPY WITH MEDIAL MENISECTOMY Right 01/01/2020   Procedure: KNEE ARTHROSCOPY WITH MEDIAL MENISECTOMY and chrondroplasty;  Surgeon: Renette Butters, MD;  Location: [facility];  Service: Orthopedics;  Laterality: Right;     Home Medications:  Prior to Admission medications   Medication Sig Start Date End Date Taking? Authorizing Provider  acetaminophen (TYLENOL) 500 MG tablet Take 500 mg by mouth every 6 (six) hours as needed.   Yes [provider]  diclofenac Sodium (VOLTAREN) 1 % GEL Apply 2 g topically 4 (four) times daily as needed (knee pain). 06/20/20  Yes Long, Wonda Olds, MD  OVER THE COUNTER MEDICATION Vitamin b 12 sl daily   Yes [provider]  benzonatate (TESSALON) 100 MG capsule Take 1 capsule (100 mg total) by mouth every  8 (eight) hours. Patient not taking: No sig reported 07/25/19   Maudie Flakes, MD  hydrochlorothiazide (MICROZIDE) 12.5 MG capsule Take 12.5 mg by mouth daily. Patient not taking: No sig reported    [provider]  ibuprofen (ADVIL) 800 MG tablet Take 1 tablet (800 mg total) by mouth every 8 (eight) hours as needed for moderate pain. Patient not taking: No sig reported 06/20/20   Long, Wonda Olds, MD  lisinopril (ZESTRIL) 5 MG tablet Take 5 mg by mouth at bedtime. Patient not taking: No sig reported    [provider]    Inpatient  Medications: Scheduled Meds: . enoxaparin (LOVENOX) injection  40 mg Subcutaneous Q24H  . metoprolol tartrate  25 mg Oral BID   Continuous Infusions: . sodium chloride     PRN Meds: acetaminophen **OR** [DISCONTINUED] acetaminophen, metoprolol tartrate, ondansetron **OR** ondansetron (ZOFRAN) IV  Allergies:    Allergies  Allergen Reactions  . Penicillins Anaphylaxis  . Coconut Oil     hives    Social History:   Social History   Socioeconomic History  . Marital status: Single    Spouse name: Not on file  . Number of children: Not on file  . Years of education: Not on file  . Highest education level: Not on file  Occupational History  . Not on file  Tobacco Use  . Smoking status: Never Smoker  . Smokeless tobacco: Never Used  Vaping Use  . Vaping Use: Never used  Substance and Sexual Activity  . Alcohol use: No  . Drug use: No  . Sexual activity: Not on file  Other Topics Concern  . Not on file  Social History Narrative  . Not on file   Social Determinants of Health   Financial Resource Strain: Not on file  Food Insecurity: Not on file  Transportation Needs: Not on file  Physical Activity: Not on file  Stress: Not on file  Social Connections: Not on file  Intimate Partner Violence: Not on file    Family History:   History reviewed. No pertinent family history.   ROS:  Please see the history of present illness.   All other ROS reviewed and negative.     Physical Exam/Data:   Vitals:   07/14/20 0345 07/14/20 0430 07/14/20 0500 07/14/20 0830  BP: (!) 135/95 (!) 154/89 (!) 143/93 (!) 151/96  Pulse: 75 75 71 62  Resp: 18 17 14 16   Temp:      TempSrc:      SpO2: 95% 96% 97% 97%  Weight:      Height:        Intake/Output Summary (Last 24 hours) at 07/14/2020 0908 Last data filed at 07/14/2020 0015 Gross per 24 hour  Intake 500 ml  Output --  Net 500 ml   Last 3 Weights 07/13/2020 01/01/2020 12/31/2019  Weight (lbs) 190 lb 193 lb 14.4 oz 182 lb   Weight (kg) 86.183 kg 87.952 kg 82.555 kg     Body mass index is 28.89 kg/m.  General:  Mildly obese female in NAD HEENT: normal Neck: no JVD Vascular: No carotid bruits  Cardiac:  normal S1, S2; RRR; no murmur - point tenderness to center chest Lungs:  clear to auscultation bilaterally, no wheezing, rhonchi or rales  Abd: soft, nontender, no hepatomegaly  Ext: no edema Musculoskeletal:  No deformities, BUE and BLE strength normal and equal Skin: warm and dry  Neuro:  CNs 2-12 intact, no focal abnormalities noted Psych:  Normal  affect   EKG:  The EKG was personally reviewed and demonstrates:  Sinus rhythm HR 95 Telemetry:  Telemetry was personally reviewed and demonstrates:  Sinus to sinus tachycardia 105, now in the 60s  Relevant CV Studies:  Echo pending  Laboratory Data:  High Sensitivity Troponin:   Recent Labs  Lab 07/13/20 2346 07/14/20 0241  TROPONINIHS 11 50*     Chemistry Recent Labs  Lab 07/13/20 2346  NA 139  K 4.2  CL 105  CO2 25  GLUCOSE 122*  BUN 24*  CREATININE 1.10*  CALCIUM 9.6  GFRNONAA >60  ANIONGAP 9    Recent Labs  Lab 07/13/20 2346  PROT 8.6*  ALBUMIN 3.7  AST 27  ALT 17  ALKPHOS 70  BILITOT 0.4   Hematology Recent Labs  Lab 07/13/20 2346  WBC 8.1  RBC 6.47*  HGB 13.5  HCT 42.8  MCV 66.2*  MCH 20.9*  MCHC 31.5  RDW 17.8*  PLT 345   BNPNo results for input(s): BNP, PROBNP in the last 168 hours.  DDimer No results for input(s): DDIMER in the last 168 hours.   Radiology/Studies:  CT Angio Chest PE W and/or Wo Contrast  Result Date: 07/14/2020 CLINICAL DATA:  Chest pain and shortness of breath. Sudden onset stabbing mid chest pain. EXAM: CT ANGIOGRAPHY CHEST WITH CONTRAST TECHNIQUE: Multidetector CT imaging of the chest was performed using the standard protocol during bolus administration of intravenous contrast. Multiplanar CT image reconstructions and MIPs were obtained to evaluate the vascular anatomy. CONTRAST:   139mL OMNIPAQUE IOHEXOL 350 MG/ML SOLN COMPARISON:  CT angiography chest 07/25/2019. FINDINGS: Cardiovascular: Satisfactory opacification of the pulmonary arteries to the segmental level. No evidence of pulmonary embolism. The main pulmonary artery is normal in caliber. Normal heart size. No significant pericardial effusion. The thoracic aorta is normal in caliber. No atherosclerotic plaque of the thoracic aorta. No coronary artery calcifications. Mediastinum/Nodes: No enlarged mediastinal, hilar, or axillary lymph nodes. Thyroid gland, trachea, and esophagus demonstrate no significant findings. Tiny hiatal hernia. Lungs/Pleura: Bilateral lower lobe subsegmental atelectasis. No focal consolidation. No pulmonary nodule. No pulmonary mass. No pleural effusion. No pneumothorax. Upper Abdomen: No acute abnormality. Musculoskeletal: No chest wall abnormality. No suspicious lytic or blastic osseous lesions. No acute displaced fracture. Multilevel degenerative changes of the spine. Review of the MIP images confirms the above findings. IMPRESSION: 1. No pulmonary embolus. 2. Tiny hiatal hernia. 3. Otherwise no acute intrathoracic abnormality. Electronically Signed   By: Iven Finn M.D.   On: 07/14/2020 04:33   DG Chest Portable 1 View  Result Date: 07/14/2020 CLINICAL DATA:  Chest pain. EXAM: PORTABLE CHEST 1 VIEW COMPARISON:  07/25/2019 FINDINGS: The heart size and mediastinal contours are within normal limits. Both lungs are clear. The visualized skeletal structures are unremarkable. IMPRESSION: No active disease. Electronically Signed   By: Lucienne Capers M.D.   On: 07/14/2020 00:01     Assessment and Plan:   Chest pain - hs troponin 11 --> 50 - EKG with sinus rhythm with HR 95 - CTA negative for PE, also mentions no coronary or aortic calcifications - events described have typical and atypical features - point tenderness on center chest, lower sternum - she does have risk factors for ACS including  untreated HTN and family history of heart disease - given her symptoms and risk factors, may consider CT coronary to rule out obstructive disease - would recommend inpatient evaluation if echo is abnormal   Tachycardia - I do not have strips,  12 lead, or telemetry evidence of the HR in the 180s - description of events from RN do sound concerning for paroxysmal SVT - this could certainly cause chest pain and mild bump in troponin - will recommend heart monitor - 14 day zio patch - HR now in the 60 - consider pill-in-the-pocket with 25 mg metoprolol   Hypertension - home medications include 5 mg lisinopril and 12.5 mg HCTZ - listed on med list, but is not taking - she states she never refilled the medications after initial 30 days - she does not know what her BP is at home - pressure elevated this morning - would restart lisinopril 5 mg    History of anemia - Hb 13.5 (11)   Risk factor modification - collect fasting lipids and A1c - she is a never smoker     Risk Assessment/Risk Scores:    HEAR Score (for undifferentiated chest pain):  HEAR Score: 2{   For questions or updates, please contact [place] Please consult [email or web address] for contact info under    Signed, Ledora Bottcher, PA  07/14/2020 9:08 AM

## 2020-07-14 NOTE — ED Notes (Signed)
Asked pt did she have to use the bathroom pt stated that she already went and cannot use it at this time pt aware of urine sample needed

## 2020-07-14 NOTE — ED Provider Notes (Signed)
[place] DEPT Provider Note   CSN: [id number] Arrival date & time: 07/13/20  2317     History Chief Complaint  Patient presents with  . Chest Pain  . Tachycardia    Nancy Davis is a 45 y.o. female.  The history is provided by the patient.  Chest Pain Pain location:  Substernal area Pain quality: not aching   Pain radiates to:  Does not radiate Pain severity:  Moderate Onset quality:  Sudden Timing:  Constant Progression:  Unchanged Chronicity:  Recurrent Context: at rest   Relieved by:  Nothing Worsened by:  Nothing Ineffective treatments:  None tried Associated symptoms: no abdominal pain, no AICD problem, no altered mental status, no anorexia, no anxiety, no back pain, no claudication, no cough, no diaphoresis, no dizziness, no dysphagia, no fatigue, no fever, no headache, no heartburn, no lower extremity edema, no nausea, no near-syncope, no numbness, no orthopnea, no palpitations, no PND, no shortness of breath, no syncope, no vomiting and no weakness   Risk factors: no aortic disease        Past Medical History:  Diagnosis Date  . Anemia    yrs ago  . Dislocation of left knee with medial meniscus tear   . Hypertension   . Knee pain, left    from old injury playing sports in high school,     There are no problems to display for this patient.   Past Surgical History:  Procedure Laterality Date  . CESAREAN SECTION  2007  . KNEE ARTHROSCOPY WITH MEDIAL MENISECTOMY Right 01/01/2020   Procedure: KNEE ARTHROSCOPY WITH MEDIAL MENISECTOMY and chrondroplasty;  Surgeon: Renette Butters, MD;  Location: [facility];  Service: Orthopedics;  Laterality: Right;     OB History   No obstetric history on file.     History reviewed. No pertinent family history.  Social History   Tobacco Use  . Smoking status: Never Smoker  . Smokeless tobacco: Never Used  Vaping Use  . Vaping Use: Never used  Substance Use  Topics  . Alcohol use: No  . Drug use: No    Home Medications Prior to Admission medications   Medication Sig Start Date End Date Taking? Authorizing Provider  acetaminophen (TYLENOL) 500 MG tablet Take 500 mg by mouth every 6 (six) hours as needed.   Yes [provider]  diclofenac Sodium (VOLTAREN) 1 % GEL Apply 2 g topically 4 (four) times daily as needed (knee pain). 06/20/20  Yes Long, Wonda Olds, MD  OVER THE COUNTER MEDICATION Vitamin b 12 sl daily   Yes [provider]  benzonatate (TESSALON) 100 MG capsule Take 1 capsule (100 mg total) by mouth every 8 (eight) hours. Patient not taking: No sig reported 07/25/19   Maudie Flakes, MD  hydrochlorothiazide (MICROZIDE) 12.5 MG capsule Take 12.5 mg by mouth daily. Patient not taking: No sig reported    [provider]  ibuprofen (ADVIL) 800 MG tablet Take 1 tablet (800 mg total) by mouth every 8 (eight) hours as needed for moderate pain. Patient not taking: No sig reported 06/20/20   Long, Wonda Olds, MD  lisinopril (ZESTRIL) 5 MG tablet Take 5 mg by mouth at bedtime. Patient not taking: No sig reported    [provider]    Allergies    Penicillins and Coconut oil  Review of Systems   Review of Systems  Constitutional: Negative for diaphoresis, fatigue and fever.  HENT: Negative for trouble swallowing.  Eyes: Negative for visual disturbance.  Respiratory: Negative for cough and shortness of breath.   Cardiovascular: Positive for chest pain. Negative for palpitations, orthopnea, claudication, syncope, PND and near-syncope.  Gastrointestinal: Negative for abdominal pain, anorexia, heartburn, nausea and vomiting.  Genitourinary: Negative for difficulty urinating.  Musculoskeletal: Negative for back pain.  Skin: Negative for rash.  Neurological: Negative for dizziness, weakness, numbness and headaches.  Psychiatric/Behavioral: Negative for agitation.  All other systems reviewed and are  negative.   Physical Exam Updated Vital Signs BP (!) 154/89   Pulse 75   Temp 97.9 F (36.6 C) (Oral)   Resp 17   Ht 5\' 8"  (1.727 m)   Wt 86.2 kg   SpO2 96%   BMI 28.89 kg/m   Physical Exam Vitals and nursing note reviewed.  Constitutional:      General: She is not in acute distress.    Appearance: Normal appearance.  HENT:     Head: Normocephalic and atraumatic.     Nose: Nose normal.  Eyes:     Conjunctiva/sclera: Conjunctivae normal.     Pupils: Pupils are equal, round, and reactive to light.  Cardiovascular:     Rate and Rhythm: Normal rate and regular rhythm.     Pulses: Normal pulses.     Heart sounds: Normal heart sounds.  Pulmonary:     Effort: Pulmonary effort is normal.     Breath sounds: Normal breath sounds.  Abdominal:     General: Abdomen is flat. Bowel sounds are normal.     Palpations: Abdomen is soft.     Tenderness: There is no abdominal tenderness. There is no guarding.  Musculoskeletal:        General: Normal range of motion.     Cervical back: Normal range of motion and neck supple.  Skin:    General: Skin is warm and dry.     Capillary Refill: Capillary refill takes less than 2 seconds.  Neurological:     General: No focal deficit present.     Mental Status: She is alert and oriented to person, place, and time.     Deep Tendon Reflexes: Reflexes normal.  Psychiatric:        Behavior: Behavior normal.     ED Results / Procedures / Treatments   Labs (all labs ordered are listed, but only abnormal results are displayed) Results for orders placed or performed during the hospital encounter of 07/13/20  Resp Panel by RT-PCR (Flu A&B, Covid) Nasopharyngeal Swab   Specimen: Nasopharyngeal Swab; Nasopharyngeal(NP) swabs in vial transport medium  Result Value Ref Range   SARS Coronavirus 2 by RT PCR NEGATIVE NEGATIVE   Influenza A by PCR NEGATIVE NEGATIVE   Influenza B by PCR NEGATIVE NEGATIVE  CBC with Differential/Platelet  Result Value  Ref Range   WBC 8.1 4.0 - 10.5 K/uL   RBC 6.47 (H) 3.87 - 5.11 MIL/uL   Hemoglobin 13.5 12.0 - 15.0 g/dL   HCT 42.8 36.0 - 46.0 %   MCV 66.2 (L) 80.0 - 100.0 fL   MCH 20.9 (L) 26.0 - 34.0 pg   MCHC 31.5 30.0 - 36.0 g/dL   RDW 17.8 (H) 11.5 - 15.5 %   Platelets 345 150 - 400 K/uL   nRBC 0.0 0.0 - 0.2 %   Neutrophils Relative % 26 %   Band Neutrophils 0 %   Lymphocytes Relative 63 %   Monocytes Relative 9 %   Eosinophils Relative 0 %   Basophils Relative 2 %  WBC Morphology NORMAL    RBC Morphology NORMAL    nRBC NONE SEEN 0 /100 WBC   Metamyelocytes Relative NONE SEEN %   Myelocytes NONE SEEN %   Promyelocytes Relative NONE SEEN %   Blasts NONE SEEN %   Immature Granulocytes NONE SEEN %   Abs Immature Granulocytes NONE SEEN 0.00 - 0.07 K/uL  Comprehensive metabolic panel  Result Value Ref Range   Sodium 139 135 - 145 mmol/L   Potassium 4.2 3.5 - 5.1 mmol/L   Chloride 105 98 - 111 mmol/L   CO2 25 22 - 32 mmol/L   Glucose, Bld 122 (H) 70 - 99 mg/dL   BUN 24 (H) 6 - 20 mg/dL   Creatinine, Ser 1.10 (H) 0.44 - 1.00 mg/dL   Calcium 9.6 8.9 - 10.3 mg/dL   Total Protein 8.6 (H) 6.5 - 8.1 g/dL   Albumin 3.7 3.5 - 5.0 g/dL   AST 27 15 - 41 U/L   ALT 17 0 - 44 U/L   Alkaline Phosphatase 70 38 - 126 U/L   Total Bilirubin 0.4 0.3 - 1.2 mg/dL   GFR, Estimated >60 >60 mL/min   Anion gap 9 5 - 15  Magnesium  Result Value Ref Range   Magnesium 1.8 1.7 - 2.4 mg/dL  I-Stat Beta hCG blood, ED ([facility], [facility], [facility] only)  Result Value Ref Range   I-stat hCG, quantitative <5.0 <5 mIU/mL   Comment 3          Troponin I (High Sensitivity)  Result Value Ref Range   Troponin I (High Sensitivity) 11 <18 ng/L  Troponin I (High Sensitivity)  Result Value Ref Range   Troponin I (High Sensitivity) 50 (H) <18 ng/L   DG Ankle Complete Right  Result Date: 06/20/2020 CLINICAL DATA:  Fall yesterday with pain and swelling. EXAM: RIGHT ANKLE - COMPLETE 3+ VIEW COMPARISON:  None. FINDINGS: Negative for  fracture or dislocation in the right ankle. Alignment of the ankle is normal. There appears to be diffuse soft tissue swelling throughout the ankle particularly along the medial aspect. IMPRESSION: Soft tissue swelling in the right ankle without acute bone abnormality. Electronically Signed   By: Markus Daft M.D.   On: 06/20/2020 13:22   CT Angio Chest PE W and/or Wo Contrast  Result Date: 07/14/2020 CLINICAL DATA:  Chest pain and shortness of breath. Sudden onset stabbing mid chest pain. EXAM: CT ANGIOGRAPHY CHEST WITH CONTRAST TECHNIQUE: Multidetector CT imaging of the chest was performed using the standard protocol during bolus administration of intravenous contrast. Multiplanar CT image reconstructions and MIPs were obtained to evaluate the vascular anatomy. CONTRAST:  173mL OMNIPAQUE IOHEXOL 350 MG/ML SOLN COMPARISON:  CT angiography chest 07/25/2019. FINDINGS: Cardiovascular: Satisfactory opacification of the pulmonary arteries to the segmental level. No evidence of pulmonary embolism. The main pulmonary artery is normal in caliber. Normal heart size. No significant pericardial effusion. The thoracic aorta is normal in caliber. No atherosclerotic plaque of the thoracic aorta. No coronary artery calcifications. Mediastinum/Nodes: No enlarged mediastinal, hilar, or axillary lymph nodes. Thyroid gland, trachea, and esophagus demonstrate no significant findings. Tiny hiatal hernia. Lungs/Pleura: Bilateral lower lobe subsegmental atelectasis. No focal consolidation. No pulmonary nodule. No pulmonary mass. No pleural effusion. No pneumothorax. Upper Abdomen: No acute abnormality. Musculoskeletal: No chest wall abnormality. No suspicious lytic or blastic osseous lesions. No acute displaced fracture. Multilevel degenerative changes of the spine. Review of the MIP images confirms the above findings. IMPRESSION: 1. No pulmonary embolus. 2. Tiny hiatal  hernia. 3. Otherwise no acute intrathoracic abnormality.  Electronically Signed   By: Iven Finn M.D.   On: 07/14/2020 04:33   DG Chest Portable 1 View  Result Date: 07/14/2020 CLINICAL DATA:  Chest pain. EXAM: PORTABLE CHEST 1 VIEW COMPARISON:  07/25/2019 FINDINGS: The heart size and mediastinal contours are within normal limits. Both lungs are clear. The visualized skeletal structures are unremarkable. IMPRESSION: No active disease. Electronically Signed   By: Lucienne Capers M.D.   On: 07/14/2020 00:01   DG Knee Complete 4 Views Right  Result Date: 06/20/2020 CLINICAL DATA:  45 year old female with pain and swelling after fall. EXAM: RIGHT KNEE - COMPLETE 4+ VIEW COMPARISON:  10/22/2019 FINDINGS: No evidence of fracture, dislocation, or joint effusion. No evidence of arthropathy or other focal bone abnormality. Soft tissues are unremarkable. IMPRESSION: No acute fracture or malalignment. Electronically Signed   By: Ruthann Cancer MD   On: 06/20/2020 13:23    EKG EKG Interpretation  Date/Time:  Sunday Jaree Trinka 24 2022 23:44:26 EDT Ventricular Rate:  95 PR Interval:  149 QRS Duration: 69 QT Interval:  339 QTC Calculation: 427 R Axis:   45 Text Interpretation: Sinus rhythm Confirmed by Dory Horn) on 07/14/2020 3:28:25 AM   Radiology CT Angio Chest PE W and/or Wo Contrast  Result Date: 07/14/2020 CLINICAL DATA:  Chest pain and shortness of breath. Sudden onset stabbing mid chest pain. EXAM: CT ANGIOGRAPHY CHEST WITH CONTRAST TECHNIQUE: Multidetector CT imaging of the chest was performed using the standard protocol during bolus administration of intravenous contrast. Multiplanar CT image reconstructions and MIPs were obtained to evaluate the vascular anatomy. CONTRAST:  137mL OMNIPAQUE IOHEXOL 350 MG/ML SOLN COMPARISON:  CT angiography chest 07/25/2019. FINDINGS: Cardiovascular: Satisfactory opacification of the pulmonary arteries to the segmental level. No evidence of pulmonary embolism. The main pulmonary artery is normal in caliber.  Normal heart size. No significant pericardial effusion. The thoracic aorta is normal in caliber. No atherosclerotic plaque of the thoracic aorta. No coronary artery calcifications. Mediastinum/Nodes: No enlarged mediastinal, hilar, or axillary lymph nodes. Thyroid gland, trachea, and esophagus demonstrate no significant findings. Tiny hiatal hernia. Lungs/Pleura: Bilateral lower lobe subsegmental atelectasis. No focal consolidation. No pulmonary nodule. No pulmonary mass. No pleural effusion. No pneumothorax. Upper Abdomen: No acute abnormality. Musculoskeletal: No chest wall abnormality. No suspicious lytic or blastic osseous lesions. No acute displaced fracture. Multilevel degenerative changes of the spine. Review of the MIP images confirms the above findings. IMPRESSION: 1. No pulmonary embolus. 2. Tiny hiatal hernia. 3. Otherwise no acute intrathoracic abnormality. Electronically Signed   By: Iven Finn M.D.   On: 07/14/2020 04:33   DG Chest Portable 1 View  Result Date: 07/14/2020 CLINICAL DATA:  Chest pain. EXAM: PORTABLE CHEST 1 VIEW COMPARISON:  07/25/2019 FINDINGS: The heart size and mediastinal contours are within normal limits. Both lungs are clear. The visualized skeletal structures are unremarkable. IMPRESSION: No active disease. Electronically Signed   By: Lucienne Capers M.D.   On: 07/14/2020 00:01    Procedures Procedures   Medications Ordered in ED Medications  metoprolol tartrate (LOPRESSOR) tablet 25 mg (has no administration in time range)  sodium chloride 0.9 % bolus 500 mL (0 mLs Intravenous Stopped 07/14/20 0015)  aspirin chewable tablet 324 mg (324 mg Oral Given 07/14/20 0439)  iohexol (OMNIPAQUE) 350 MG/ML injection 100 mL (100 mLs Intravenous Contrast Given 07/14/20 0408)    ED Course  I have reviewed the triage vital signs and the nursing notes.  Pertinent labs &  imaging results that were available during my care of the patient were reviewed by me and considered in  my medical decision making (see chart for details).   4 am case d/w Cardiology fellow, please admit to medicine.  Start 25 mg metoprolol BID and cards will consult    Nancy Davis was evaluated in Emergency Department on 07/14/2020 for the symptoms described in the history of present illness. She was evaluated in the context of the global COVID-19 pandemic, which necessitated consideration that the patient might be at risk for infection with the SARS-CoV-2 virus that causes COVID-19. Institutional protocols and algorithms that pertain to the evaluation of patients at risk for COVID-19 are in a state of rapid change based on information released by regulatory bodies including the CDC and federal and state organizations. These policies and algorithms were followed during the patient's care in the ED.  Final Clinical Impression(s) / ED Diagnoses Final diagnoses:  None    Admit to medicine for elevated troponin and rapidHR of undetermined etiology    Keny Donald, MD 07/14/20 [id number]

## 2020-07-14 NOTE — Discharge Instructions (Signed)
Hypertension, Adult High blood pressure (hypertension) is when the force of blood pumping through the arteries is too strong. The arteries are the blood vessels that carry blood from the heart throughout the body. Hypertension forces the heart to work harder to pump blood and may cause arteries to become narrow or stiff. Untreated or uncontrolled hypertension can cause a heart attack, heart failure, a stroke, kidney disease, and other problems. A blood pressure reading consists of a higher number over a lower number. Ideally, your blood pressure should be below 120/80. The first ("top") number is called the systolic pressure. It is a measure of the pressure in your arteries as your heart beats. The second ("bottom") number is called the diastolic pressure. It is a measure of the pressure in your arteries as the heart relaxes. What are the causes? The exact cause of this condition is not known. There are some conditions that result in or are related to high blood pressure. What increases the risk? Some risk factors for high blood pressure are under your control. The following factors may make you more likely to develop this condition:  Smoking.  Having type 2 diabetes mellitus, high cholesterol, or both.  Not getting enough exercise or physical activity.  Being overweight.  Having too much fat, sugar, calories, or salt (sodium) in your diet.  Drinking too much alcohol. Some risk factors for high blood pressure may be difficult or impossible to change. Some of these factors include:  Having chronic kidney disease.  Having a family history of high blood pressure.  Age. Risk increases with age.  Race. You may be at higher risk if you are African American.  Gender. Men are at higher risk than women before age 45. After age 65, women are at higher risk than men.  Having obstructive sleep apnea.  Stress. What are the signs or symptoms? High blood pressure may not cause symptoms. Very high  blood pressure (hypertensive crisis) may cause:  Headache.  Anxiety.  Shortness of breath.  Nosebleed.  Nausea and vomiting.  Vision changes.  Severe chest pain.  Seizures. How is this diagnosed? This condition is diagnosed by measuring your blood pressure while you are seated, with your arm resting on a flat surface, your legs uncrossed, and your feet flat on the floor. The cuff of the blood pressure monitor will be placed directly against the skin of your upper arm at the level of your heart. It should be measured at least twice using the same arm. Certain conditions can cause a difference in blood pressure between your right and left arms. Certain factors can cause blood pressure readings to be lower or higher than normal for a short period of time:  When your blood pressure is higher when you are in a health care provider's office than when you are at home, this is called white coat hypertension. Most people with this condition do not need medicines.  When your blood pressure is higher at home than when you are in a health care provider's office, this is called masked hypertension. Most people with this condition may need medicines to control blood pressure. If you have a high blood pressure reading during one visit or you have normal blood pressure with other risk factors, you may be asked to:  Return on a different day to have your blood pressure checked again.  Monitor your blood pressure at home for 1 week or longer. If you are diagnosed with hypertension, you may have other blood or   imaging tests to help your health care provider understand your overall risk for other conditions. How is this treated? This condition is treated by making healthy lifestyle changes, such as eating healthy foods, exercising more, and reducing your alcohol intake. Your health care provider may prescribe medicine if lifestyle changes are not enough to get your blood pressure under control, and  if:  Your systolic blood pressure is above 130.  Your diastolic blood pressure is above 80. Your personal target blood pressure may vary depending on your medical conditions, your age, and other factors. Follow these instructions at home: Eating and drinking  Eat a diet that is high in fiber and potassium, and low in sodium, added sugar, and fat. An example eating plan is called the DASH (Dietary Approaches to Stop Hypertension) diet. To eat this way: ? Eat plenty of fresh fruits and vegetables. Try to fill one half of your plate at each meal with fruits and vegetables. ? Eat whole grains, such as whole-wheat pasta, brown rice, or whole-grain bread. Fill about one fourth of your plate with whole grains. ? Eat or drink low-fat dairy products, such as skim milk or low-fat yogurt. ? Avoid fatty cuts of meat, processed or cured meats, and poultry with skin. Fill about one fourth of your plate with lean proteins, such as fish, chicken without skin, beans, eggs, or tofu. ? Avoid pre-made and processed foods. These tend to be higher in sodium, added sugar, and fat.  Reduce your daily sodium intake. Most people with hypertension should eat less than 1,500 mg of sodium a day.  Do not drink alcohol if: ? Your health care provider tells you not to drink. ? You are pregnant, may be pregnant, or are planning to become pregnant.  If you drink alcohol: ? Limit how much you use to:  0-1 drink a day for women.  0-2 drinks a day for men. ? Be aware of how much alcohol is in your drink. In the [place] one drink equals one 12 oz bottle of beer (355 mL), one 5 oz glass of wine (148 mL), or one 1 oz glass of hard liquor (44 mL).   Lifestyle  Work with your health care provider to maintain a healthy body weight or to lose weight. Ask what an ideal weight is for you.  Get at least 30 minutes of exercise most days of the week. Activities may include walking, swimming, or biking.  Include exercise to  strengthen your muscles (resistance exercise), such as Pilates or lifting weights, as part of your weekly exercise routine. Try to do these types of exercises for 30 minutes at least 3 days a week.  Do not use any products that contain nicotine or tobacco, such as cigarettes, e-cigarettes, and chewing tobacco. If you need help quitting, ask your health care provider.  Monitor your blood pressure at home as told by your health care provider.  Keep all follow-up visits as told by your health care provider. This is important.   Medicines  Take over-the-counter and prescription medicines only as told by your health care provider. Follow directions carefully. Blood pressure medicines must be taken as prescribed.  Do not skip doses of blood pressure medicine. Doing this puts you at risk for problems and can make the medicine less effective.  Ask your health care provider about side effects or reactions to medicines that you should watch for. Contact a health care provider if you:  Think you are having a reaction to a   medicine you are taking.  Have headaches that keep coming back (recurring).  Feel dizzy.  Have swelling in your ankles.  Have trouble with your vision. Get help right away if you:  Develop a severe headache or confusion.  Have unusual weakness or numbness.  Feel faint.  Have severe pain in your chest or abdomen.  Vomit repeatedly.  Have trouble breathing. Summary  Hypertension is when the force of blood pumping through your arteries is too strong. If this condition is not controlled, it may put you at risk for serious complications.  Your personal target blood pressure may vary depending on your medical conditions, your age, and other factors. For most people, a normal blood pressure is less than 120/80.  Hypertension is treated with lifestyle changes, medicines, or a combination of both. Lifestyle changes include losing weight, eating a healthy, low-sodium diet,  exercising more, and limiting alcohol. This information is not intended to replace advice given to you by your health care provider. Make sure you discuss any questions you have with your health care provider. Document Revised: 11/16/2017 Document Reviewed: 11/16/2017 Elsevier Patient Education  2021 [place].   Supraventricular Tachycardia, Adult Supraventricular tachycardia (SVT) is a type of abnormal heart rhythm. It causes the heart to beat very quickly. SVT can start suddenly and last for a short time, which is called paroxysmal SVT, or it may last longer and require specialized treatment to return the heart rhythm to normal. A normal resting heart rate is 60-100 beats per minute. During an episode of SVT, your heart rate may be higher than 150 beats per minute. Episodes of SVT can be frightening, but they are usually not dangerous. However, if episodes happen several times a day or last longer than a few seconds, they may lead to heart failure. What are the causes? Usually, a normal heartbeat starts when an area called the sinoatrial node releases an electrical signal. In SVT, other areas of the heart send out electrical signals that interfere with the signal from the sinoatrial node. The cause of this abnormal electrical activity is not known.   What increases the risk? You are more likely to develop this condition if you are:  Middle aged or younger.  Female. The following factors may also make you more likely to develop this condition:  Stress or anxiety.  Tiredness.  Smoking.  Stimulant drugs, such as cocaine and methamphetamine.  Alcohol.  Caffeine.  Pregnancy.  Having any of these conditions: ? A thyroid condition. ? Diabetes mellitus. ? Obstructive sleep apnea. What are the signs or symptoms? Symptoms of this condition include:  A pounding heart.  A feeling that the heart is skipping beats (palpitations).  Weakness.  Shortness of breath.  Tightness or  pain in your chest.  Light-headedness or dizziness.  Anxiety.  Sweating.  Nausea.  Fainting.  Fatigue or tiredness. A mild episode may not cause symptoms. How is this diagnosed? This condition may be diagnosed based on:  Your symptoms.  A physical exam. If you have an episode of SVT during the exam, the health care provider may be able to diagnose SVT by listening to your heart and feeling your pulse.  Tests. These may include: ? An electrocardiogram (ECG). This test is done to check for problems with electrical activity in the heart. ? A Holter monitor or event monitor test. This test involves wearing a portable device that monitors your heart rate over time. ? An echocardiogram. This test involves taking an image of your  heart using sound waves. It is done to rule out other causes of a fast heart rate. ? A stress echocardiogram. This test involves doing an echocardiogram when you are at rest and after exercise. ? Blood tests. ? An electrophysiology study (EPS). This tests the electrical activity in your heart to find where the abnormal heart rhythm is coming from using cardiac catheters. How is this treated? This condition may be treated with:  Vagal nerve stimulation. This involves stimulating your vagus nerve, which is a nerve that runs from the chest, through the neck, to the lower part of the brain. Stimulating this nerve can slow down the heart. It is often the first and only treatment that is needed for this condition. Work with your health care provider to find which technique works best for you. Ways to do this treatment include: ? Laying on your back, then holding your breath and pushing, as though you are having a bowel movement. ? Massaging an area on one side of your neck, below your jaw. Do not try this yourself. Only a health care provider should do this. If done the wrong way, it can lead to a stroke. ? Bending forward with your head between your legs. ? Coughing  while bending forward with your head between your legs. ? Applying an ice-cold, wet towel to your face.  Medicines that prevent attacks.  Medicine to stop an attack. The medicine is given through an IV at the hospital.  A small electric shock (cardioversion) that stops an attack. Before you get the shock, you will get medicine to make you fall asleep.  Radiofrequency ablation. In this procedure, a small, thin tube (catheter) is used to send radiofrequency energy to the area of tissue that is causing the rapid heartbeats. The energy kills the cells and helps your heart keep a normal rhythm. You may have this treatment if you have symptoms of SVT often. If you do not have symptoms, you may not need treatment. Follow these instructions at home: Stress  Avoid stressful situations when possible.  Find healthy ways of managing stress, such as: ? Taking part in relaxing activities, such as yoga, meditation, or being out in nature. ? Listening to relaxing music. ? Practicing relaxation techniques, such as deep breathing. ? Leading a healthy lifestyle. This involves getting plenty of sleep, exercising, and eating a balanced diet. ? Attending counseling or talk therapy with a mental health professional. Lifestyle  Try to get at least 7 hours of sleep each night.  Do not use any products that contain nicotine or tobacco. These products include cigarettes, chewing tobacco, and vaping devices, such as e-cigarettes. If you need help quitting, ask your health care provider.  Do not drink alcohol if it triggers episodes of SVT.  If alcohol does not seem to trigger episodes, limit your alcohol intake. If you drink alcohol: ? Limit how much you have to:  0-1 drink a day for women who are not pregnant.  0-2 drinks a day for men. ? Know how much alcohol is in your drink. In the [place] one drink equals one 12 oz bottle of beer (355 mL), one 5 oz glass of wine (148 mL), or one 1 oz glass of hard liquor  (44 mL).  Be aware of how caffeine affects your condition. If caffeine: ? Triggers episodes of SVT, do not eat, drink, or use anything with caffeine in it. ? Does not seem to trigger episodes, consume caffeine in moderation.  Do not use  stimulant drugs. If you need help quitting, talk with your health care provider.   General instructions  Maintain a healthy weight.  Exercise regularly. Ask your health care provider to suggest some good activities for you. Aim for one or a combination of the following: ? 150 minutes per week of moderate exercise, such as walking or yoga. ? 75 minutes per week of vigorous exercise, such as running or swimming.  Perform vagus nerve stimulation as directed by your health care provider.  Take over-the-counter and prescription medicines only as told by your health care provider.  Keep all follow-up visits. This is important. Contact a health care provider if:  You have episodes of SVT more often than before.  Episodes of SVT last longer than before.  Vagus nerve stimulation is no longer helping.  You have new symptoms. Get help right away if:  You have chest pain.  Your symptoms get worse.  You have trouble breathing.  You have an episode of SVT that lasts longer than 20 minutes.  You faint. These symptoms may represent a serious problem that is an emergency. Do not wait to see if the symptoms will go away. Get medical help right away. Call your local emergency services (911 in the [place] Do not drive yourself to the hospital. Summary  Supraventricular tachycardia (SVT) is a type of abnormal heart rhythm.  During an episode of SVT, your heart rate may be higher than 150 beats per minute.  If you do not have symptoms, you may not need treatment. This information is not intended to replace advice given to you by your health care provider. Make sure you discuss any questions you have with your health care provider. Document Revised:  10/20/2019 Document Reviewed: 10/20/2019 Elsevier Patient Education  [place].

## 2020-07-14 NOTE — Discharge Summary (Signed)
DISCHARGE SUMMARY  Nancy Davis  MR#: [id number]  DOB:09-23-1975  Date of Admission: 07/13/2020 Date of Discharge: 07/14/2020  Attending Physician:Gurveer Colucci Hennie Duos, MD  Patient's GGY:IRSWNIO, Provider, MD  Consults: [facility]   Disposition: D/C home   Follow-up Appts:  Follow-up Information    Hilty, Nadean Corwin, MD Follow up.   Specialty: Cardiology Why: The office will call you to arrange a follow up appointment.  Contact information: [address] [place] [id number] [phone]        Your current Primary Care Provider Follow up.   Why: See your current Primary Care Provider within the next 10-14 days as we discussed for a check of your blood pressure and medication refills.               Tests Needing Follow-up: [facility] to arrange for outpt f/u as well as ambulatory heart monitor -f/u BP control and determine if BP meds need to be adjusted/refilled   Discharge Diagnoses: Sharp chest pain Tachyarrhythmia Mild dehydration HTN Chronic anemia Newly diagnosed grade 2 Diastolic CHF w/o acute exacerbation   Initial presentation: 44yo with a hx of chronic anemia, left knee dislocation/meniscal tear, and HTN who presented to the ED with chest pain, palpitations, dizziness, shortness of breath, and anxiety.  She was tearful and diaphoretic in triage and was found to have a heart rate of 185.  Before an EKG could be obtained her tachycardia had resolved following a vagal meneuver.  She reported a similar episode 4 years ago at which time she was told she had "a leaky valve."  Hospital Course:  Sharp chest pain w/ Tachyarrhythmia CTa negative for pulmonary embolism or coronary artery calcifications - tachycardia spontaneously resolved - UDS ordered but not obtained - Cards has evaluated - suspicious for PSVT - to have outpt cardiac monitor arranged by [facility] - BB initiated - free T4 and TSH unremarkable  - random serum cortisol  normal   Mild dehydration BUN and creatinine modestly elevated above baseline in a pattern suggestive of volume depletion - hydrated during stay in hospital   HTN Blood pressure modestly elevated at 371-696 systolic - 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 - encouraged and explained need for strict compliance w/ medications - provided pt w/ new Rx for ACEi/HCTZ as well as BB   Newly diagnosed grade 2 Diastolic CHF w/o acute exacerbation  Noted on TTE w/ preserved systolic fxn - c/w HTN damage - discussed w/ pt - resuming BP meds which pt was no longer compliant with - no volume overload this admit   Chronic anemia Is presently not anemic with hemoglobin 13.5  Allergies as of 07/14/2020      Reactions   Penicillins Anaphylaxis   Coconut Oil    hives      Medication List    TAKE these medications   acetaminophen 500 MG tablet Commonly known as: TYLENOL Take 500 mg by mouth every 6 (six) hours as needed.   diclofenac Sodium 1 % Gel Commonly known as: Voltaren Apply 2 g topically 4 (four) times daily as needed (knee pain).   lisinopril-hydrochlorothiazide 10-12.5 MG tablet Commonly known as: Zestoretic Take 1 tablet by mouth daily.   metoprolol succinate 25 MG 24 hr tablet Commonly known as: TOPROL-XL Take 1 tablet (25 mg total) by mouth daily. Take with or immediately following a meal. Start taking on: July 15, 2020   OVER THE COUNTER MEDICATION Vitamin b 12 sl daily  Day of Discharge BP (!) 156/99   Pulse 72   Temp 97.9 F (36.6 C) (Oral)   Resp 18   Ht 5\' 8"  (1.727 m)   Wt 86.2 kg   SpO2 100%   BMI 28.89 kg/m   Physical Exam: General: No acute respiratory distress Lungs: Clear to auscultation bilaterally without wheezes or crackles Cardiovascular: Regular rate and rhythm without murmur gallop or rub normal S1 and S2 Abdomen: Nontender, nondistended, soft, bowel sounds positive, no rebound, no ascites, no appreciable  mass Extremities: No significant cyanosis, clubbing, or edema bilateral lower extremities  Basic Metabolic Panel: Recent Labs  Lab 07/13/20 2346 07/14/20 0241  NA 139  --   K 4.2  --   CL 105  --   CO2 25  --   GLUCOSE 122*  --   BUN 24*  --   CREATININE 1.10*  --   CALCIUM 9.6  --   MG  --  1.8    Liver Function Tests: Recent Labs  Lab 07/13/20 2346  AST 27  ALT 17  ALKPHOS 70  BILITOT 0.4  PROT 8.6*  ALBUMIN 3.7   CBC: Recent Labs  Lab 07/13/20 2346  WBC 8.1  HGB 13.5  HCT 42.8  MCV 66.2*  PLT 345    Recent Results (from the past 240 hour(s))  Resp Panel by RT-PCR (Flu A&B, Covid) Nasopharyngeal Swab     Status: None   Collection Time: 07/14/20  3:58 AM   Specimen: Nasopharyngeal Swab; Nasopharyngeal(NP) swabs in vial transport medium  Result Value Ref Range Status   SARS Coronavirus 2 by RT PCR NEGATIVE NEGATIVE Final    Comment: (NOTE) SARS-CoV-2 target nucleic acids are NOT DETECTED.  The SARS-CoV-2 RNA is generally detectable in upper respiratory specimens during the acute phase of infection. The lowest concentration of SARS-CoV-2 viral copies this assay can detect is 138 copies/mL. A negative result does not preclude SARS-Cov-2 infection and should not be used as the sole basis for treatment or other patient management decisions. A negative result may occur with  improper specimen collection/handling, submission of specimen other than nasopharyngeal swab, presence of viral mutation(s) within the areas targeted by this assay, and inadequate number of viral copies(<138 copies/mL). A negative result must be combined with clinical observations, patient history, and epidemiological information. The expected result is Negative.  Fact Sheet for Patients:  EntrepreneurPulse.com.au  Fact Sheet for Healthcare Providers:  IncredibleEmployment.be  This test is no t yet approved or cleared by the [place] FDA and   has been authorized for detection and/or diagnosis of SARS-CoV-2 by FDA under an Emergency Use Authorization (EUA). This EUA will remain  in effect (meaning this test can be used) for the duration of the COVID-19 declaration under Section 564(b)(1) of the Act, 21 U.S.C.section 360bbb-3(b)(1), unless the authorization is terminated  or revoked sooner.       Influenza A by PCR NEGATIVE NEGATIVE Final   Influenza B by PCR NEGATIVE NEGATIVE Final    Comment: (NOTE) The Xpert Xpress SARS-CoV-2/FLU/RSV plus assay is intended as an aid in the diagnosis of influenza from Nasopharyngeal swab specimens and should not be used as a sole basis for treatment. Nasal washings and aspirates are unacceptable for Xpert Xpress SARS-CoV-2/FLU/RSV testing.  Fact Sheet for Patients: EntrepreneurPulse.com.au  Fact Sheet for Healthcare Providers: IncredibleEmployment.be  This test is not yet approved or cleared by the [place] FDA and has been authorized for detection and/or diagnosis of SARS-CoV-2 by FDA under  an Emergency Use Authorization (EUA). This EUA will remain in effect (meaning this test can be used) for the duration of the COVID-19 declaration under Section 564(b)(1) of the Act, 21 U.S.C. section 360bbb-3(b)(1), unless the authorization is terminated or revoked.  Performed at [facility], [place] [address], [place], [place] [address]      Time spent in discharge (includes decision making & examination of pt): 30 minutes  07/14/2020, 5:47 PM   Cherene Altes, MD [facility] Office  [phone]

## 2020-07-14 NOTE — Progress Notes (Signed)
  Echocardiogram 2D Echocardiogram has been performed.  Nancy Davis 07/14/2020, 8:40 AM

## 2020-07-14 NOTE — Progress Notes (Signed)
I have ordered her zio heart monitor to be mailed to her house. She should expect a call from our office to set up emergency contact.   I have also arranged cardiology follow up with me following zio and for BP.   Tami Lin Sheneika Walstad, PA-C 07/14/2020, 2:23 PM

## 2020-07-14 NOTE — H&P (Signed)
History and Physical    Nancy Davis PIR:[id number] DOB: 01-25-1976 DOA: 07/13/2020  PCP: Default, Provider, MD  Patient coming from: Home.  I have personally briefly reviewed patient's old medical records in [place]  Chief Complaint: Chest pain.  HPI: Nancy Davis is a 45 y.o. female with medical history significant of unspecified anemia, left knee dislocation meniscal tear, hypertension who is coming to the emergency department due to precordial chest pain associated with palpitations, anxiety, nausea, dizziness and mild dyspnea.  Per ED triage nurse, she presented tearful and diaphoretic to the emergency department with a heart rate of 185 bpm.  Her tachycardia subsided before the Dr. Nicholes Stairs was able to fully evaluate her and an EKG tracing was performed.  At about 4 years ago, the patient had a similar episode while in [place], had an echocardiogram performed and was told that she had a leaky valve.  However, she is unable to a specify further.  She describes her mother and all other female relatives have a similar history.  She denies PND, orthopnea or recent pitting edema of the lower extremities.  She denies fever, chills, sore throat, rhinorrhea, wheezing or hemoptysis.  She denies abdominal pain, diarrhea, constipation, melena hematochezia.  No dysuria, frequency or hematuria.  No polyuria, polydipsia, polyphagia or blurred vision.  ED Course: Initial vital signs were temperature 97.9 F, pulse 184, respirations 25, BP 119/105 mmHg and O2 sat 98% on room air.  The patient received aspirin and 25 mg of metoprolol p.o. in the emergency department.  Cardiology on-call was consulted and requested for the patient to be admitted.  Lab work: CBC showed a white count of 8.1, hemoglobin 13.5 g/dL with an MCV of 66.2 fL and platelets 345. Troponin was 11 and then 50 ng/L.  CMP showed normal electrolytes, but her glucose 122, creatinine 1.10 and BUN 24 mg/dL.  Total protein was  elevated 8.6 g/dL.  The rest of the CMP was normal.    Imaging: A One-view portable chest radiograph did not show any active disease.  CTA chest did not show PE.  There was a very small hiatal hernia.  Please see images and full radiology reports for further detail.  Review of Systems: As per HPI otherwise all other systems reviewed and are negative.  Past Medical History:  Diagnosis Date  . Anemia    yrs ago  . Dislocation of left knee with medial meniscus tear   . Hypertension   . Knee pain, left    from old injury playing sports in high school,     Past Surgical History:  Procedure Laterality Date  . CESAREAN SECTION  2007  . KNEE ARTHROSCOPY WITH MEDIAL MENISECTOMY Right 01/01/2020   Procedure: KNEE ARTHROSCOPY WITH MEDIAL MENISECTOMY and chrondroplasty;  Surgeon: Renette Butters, MD;  [facility];  Service: Orthopedics;  Laterality: Right;    Social History  reports that she has never smoked. She has never used smokeless tobacco. She reports that she does not drink alcohol and does not use drugs.  Allergies  Allergen Reactions  . Penicillins Anaphylaxis  . Coconut Oil     hives   Family medical history  Mother and grandmother history of heart disease. Multiple family members with diabetes and hypertension.  Prior to Admission medications   Medication Sig Start Date End Date Taking? Authorizing Provider  acetaminophen (TYLENOL) 500 MG tablet Take 500 mg by mouth every 6 (six) hours as needed.   Yes [provider]  diclofenac Sodium (VOLTAREN) 1 % GEL Apply 2 g topically 4 (four) times daily as needed (knee pain). 06/20/20  Yes Long, Wonda Olds, MD  OVER THE COUNTER MEDICATION Vitamin b 12 sl daily   Yes [provider]  benzonatate (TESSALON) 100 MG capsule Take 1 capsule (100 mg total) by mouth every 8 (eight) hours. Patient not taking: No sig reported 07/25/19   Maudie Flakes, MD  hydrochlorothiazide (MICROZIDE) 12.5 MG  capsule Take 12.5 mg by mouth daily. Patient not taking: No sig reported    [provider]  ibuprofen (ADVIL) 800 MG tablet Take 1 tablet (800 mg total) by mouth every 8 (eight) hours as needed for moderate pain. Patient not taking: No sig reported 06/20/20   Long, Wonda Olds, MD  lisinopril (ZESTRIL) 5 MG tablet Take 5 mg by mouth at bedtime. Patient not taking: No sig reported    [provider]    Physical Exam: Vitals:   07/14/20 0313 07/14/20 0345 07/14/20 0430 07/14/20 0500  BP: (!) 148/89 (!) 135/95 (!) 154/89 (!) 143/93  Pulse: 64 75 75 71  Resp: 15 18 17 14   Temp:      TempSrc:      SpO2: 96% 95% 96% 97%  Weight:      Height:       Constitutional: NAD, calm, comfortable Eyes: PERRL, lids and conjunctivae normal ENMT: Mucous membranes are moist. Posterior pharynx clear of any exudate or lesions. Neck: normal, supple, no masses, no thyromegaly Respiratory: clear to auscultation bilaterally, no wheezing, no crackles. Normal respiratory effort. No accessory muscle use.  Cardiovascular: Regular rate and rhythm, no murmurs / rubs / gallops. No extremity edema. 2+ pedal pulses. No carotid bruits.  Abdomen: Soft, no tenderness, no masses palpated. No hepatosplenomegaly. Bowel sounds positive.  Musculoskeletal: no clubbing / cyanosis.  Believes good ROM, no contractures. Normal muscle tone.  Skin: no rashes, lesions, ulcers. No induration Neurologic: CN 2-12 grossly intact. Sensation intact, DTR normal. Strength 5/5 in all 4.  Psychiatric: Normal judgment and insight. Alert and oriented x 3. Normal mood.   Labs on Admission: I have personally reviewed following labs and imaging studies  CBC: Recent Labs  Lab 07/13/20 2346  WBC 8.1  HGB 13.5  HCT 42.8  MCV 66.2*  PLT 782    Basic Metabolic Panel: Recent Labs  Lab 07/13/20 2346 07/14/20 0241  NA 139  --   K 4.2  --   CL 105  --   CO2 25  --   GLUCOSE 122*  --   BUN 24*  --   CREATININE 1.10*  --    CALCIUM 9.6  --   MG  --  1.8    GFR: Estimated Creatinine Clearance: 75 mL/min (A) (by C-G formula based on SCr of 1.1 mg/dL (H)).  Liver Function Tests: Recent Labs  Lab 07/13/20 2346  AST 27  ALT 17  ALKPHOS 70  BILITOT 0.4  PROT 8.6*  ALBUMIN 3.7    Urine analysis: No results found for: COLORURINE, APPEARANCEUR, LABSPEC, PHURINE, GLUCOSEU, HGBUR, BILIRUBINUR, KETONESUR, PROTEINUR, UROBILINOGEN, NITRITE, LEUKOCYTESUR  Radiological Exams on Admission: CT Angio Chest PE W and/or Wo Contrast  Result Date: 07/14/2020 CLINICAL DATA:  Chest pain and shortness of breath. Sudden onset stabbing mid chest pain. EXAM: CT ANGIOGRAPHY CHEST WITH CONTRAST TECHNIQUE: Multidetector CT imaging of the chest was performed using the standard protocol during bolus administration of intravenous contrast. Multiplanar CT image reconstructions and MIPs were  obtained to evaluate the vascular anatomy. CONTRAST:  164mL OMNIPAQUE IOHEXOL 350 MG/ML SOLN COMPARISON:  CT angiography chest 07/25/2019. FINDINGS: Cardiovascular: Satisfactory opacification of the pulmonary arteries to the segmental level. No evidence of pulmonary embolism. The main pulmonary artery is normal in caliber. Normal heart size. No significant pericardial effusion. The thoracic aorta is normal in caliber. No atherosclerotic plaque of the thoracic aorta. No coronary artery calcifications. Mediastinum/Nodes: No enlarged mediastinal, hilar, or axillary lymph nodes. Thyroid gland, trachea, and esophagus demonstrate no significant findings. Tiny hiatal hernia. Lungs/Pleura: Bilateral lower lobe subsegmental atelectasis. No focal consolidation. No pulmonary nodule. No pulmonary mass. No pleural effusion. No pneumothorax. Upper Abdomen: No acute abnormality. Musculoskeletal: No chest wall abnormality. No suspicious lytic or blastic osseous lesions. No acute displaced fracture. Multilevel degenerative changes of the spine. Review of the MIP images  confirms the above findings. IMPRESSION: 1. No pulmonary embolus. 2. Tiny hiatal hernia. 3. Otherwise no acute intrathoracic abnormality. Electronically Signed   By: Iven Finn M.D.   On: 07/14/2020 04:33   DG Chest Portable 1 View  Result Date: 07/14/2020 CLINICAL DATA:  Chest pain. EXAM: PORTABLE CHEST 1 VIEW COMPARISON:  07/25/2019 FINDINGS: The heart size and mediastinal contours are within normal limits. Both lungs are clear. The visualized skeletal structures are unremarkable. IMPRESSION: No active disease. Electronically Signed   By: Lucienne Capers M.D.   On: 07/14/2020 00:01    EKG: Independently reviewed.  Vent. rate 95 BPM PR interval 149 ms QRS duration 69 ms QT/QTcB 339/427 ms P-R-T axes 67 45 39 Sinus rhythm.  Assessment/Plan Principal Problem:   Tachyarrhythmia   Chest pain By history MVP is high suspicion. Placing observation/telemetry. Continue metoprolol 25 mg p.o. twice daily. Metoprolol 5 mg every 6 hours as needed for tachycardia. Optimize potassium and magnesium. Obtaining echocardiogram. Cardiology will evaluate.  Active Problems:   Microcytosis Monitor H&H. Check iron studies.    Hypertension Has not been taking lisinopril or HCTZ. Continue metoprolol 25 mg p.o. twice daily. Monitor BP and heart rate.   DVT prophylaxis: Lovenox SQ. Code Status:   Full code. Family Communication: Disposition Plan:   Patient is from:  Home.  Anticipated DC to:  Home.  Anticipated DC date:  07/14/2020 or 07/15/2020.  Anticipated DC barriers: Clinical status/consultant sign off.  Consults called:  The patient is on the cardiology rounding list. Admission status:  Observation/progressive unit.  Severity of Illness:  High severity due to presenting with precordial chest pain in the setting of tachyarrhythmia.  The patient will need to remain for further evaluation and treatment.  Reubin Milan MD [facility]  How to contact the [facility] Attending or  Consulting provider [place] or covering provider during after hours [place], for this patient?   1. Check the care team in [facility] and look for a) attending/consulting [facility] provider listed and b) the [facility] team listed 2. Log into [email or web address] and use 's universal password to access. If you do not have the password, please contact the hospital operator. 3. Locate the [facility] provider you are looking for under [facility] and page to a number that you can be directly reached. 4. If you still have difficulty reaching the provider, please page the [facility] (Director on Call) for the Hospitalists listed on amion for assistance.  07/14/2020, 6:27 AM   This document was prepared using Dragon voice recognition software may contain some unintended transcription errors.

## 2020-07-14 NOTE — Progress Notes (Signed)
Enrolled patient for a 14 day Zio XT  monitor to be mailed to patients home  °

## 2020-07-16 DIAGNOSIS — R009 Unspecified abnormalities of heart beat: Secondary | ICD-10-CM

## 2020-07-16 DIAGNOSIS — R030 Elevated blood-pressure reading, without diagnosis of hypertension: Secondary | ICD-10-CM | POA: Diagnosis not present

## 2020-07-16 DIAGNOSIS — R000 Tachycardia, unspecified: Secondary | ICD-10-CM

## 2020-07-16 DIAGNOSIS — R002 Palpitations: Secondary | ICD-10-CM | POA: Diagnosis not present

## 2020-08-11 NOTE — Progress Notes (Signed)
Cardiology Office Note:    Date:  08/12/2020   ID:  Nancy Davis, DOB 30-Aug-1975, MRN [id number]  PCP:  Default, Provider, MD  Cardiologist:  Pixie Casino, MD   Referring MD: No ref. provider found   Chief Complaint  Patient presents with  . Follow-up  palpitations, chest pain  History of Present Illness:    Nancy Davis is a 45 y.o. female with a hx of hypertension and tachycardia. She was initially diagnosed with both in [place], but stopped taking medications over 20 years ago. Hx of lisinopril and HCTZ along with ?BB. She presented to the ER 07/14/20 with tachycardia and chest pain. 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 She had chest discomfort with this, which resolved with resolution of her tachycardia. Echo in the ER with normal EF, but evidence of hypertensive heart disease with elevated LVEDP and grade 2 DD, mild to moderate TR. CTA negative for PE and did not show aortic atherosclerosis or coronary calcifications. Heart monitor was placed and she was restarted on lisinopril-HCTZ. Toprol was also added.   Heart monitor showed short runs of NSVT (< 5 beats) and PSVT.   She presents today for hospital follow up. She reports that she continues to have heart racing associated with chest tightness and diaphoresis, lasting about 30 min. She describes getting "overheated." She states her heart pounds for the entire 30 minutes. To resolved these episodes, she drinks room temperature water, places a wet towel on her neck, and rests her head down. She denies syncope. These episodes are occurring every day, last episode was this morning at work. She walks every Sat with her daughter for usually 4 miles, which has been occurring since she moved here several years ago. She denies syncope or near syncope. Her great uncle just passed away - states his heart just stopped. This occurred one week ago.  He was 45 yo. She is very tearful, as expected. Heart disease does run on her mother's side of the family - some premature in their 75s. Her sister just had PCI at age 53. She is quite nervous at her sister's young age and states she (the patient) has been ignoring her health for some time.     Past Medical History:  Diagnosis Date  . Anemia    yrs ago  . Dislocation of left knee with medial meniscus tear   . Hypertension   . Knee pain, left    from old injury playing sports in high school,     Past Surgical History:  Procedure Laterality Date  . CESAREAN SECTION  2007  . KNEE ARTHROSCOPY WITH MEDIAL MENISECTOMY Right 01/01/2020   Procedure: KNEE ARTHROSCOPY WITH MEDIAL MENISECTOMY and chrondroplasty;  Surgeon: Renette Butters, MD;  Location: [facility];  Service: Orthopedics;  Laterality: Right;    Current Medications: Current Meds  Medication Sig  . acetaminophen (TYLENOL) 500 MG tablet Take 500 mg by mouth every 6 (six) hours as needed.  Marland Kitchen amLODipine (NORVASC) 5 MG tablet Take 1 tablet (5 mg total) by mouth daily.  . diclofenac Sodium (VOLTAREN) 1 % GEL Apply 2 g topically 4 (four) times daily as needed (knee pain).  Marland Kitchen lisinopril-hydrochlorothiazide (ZESTORETIC) 10-12.5 MG tablet Take 1 tablet by mouth daily.  . metoprolol succinate (TOPROL-XL) 50 MG 24 hr tablet Take 1 tablet (50 mg total) by mouth daily. Take with or immediately following  a meal.  . OVER THE COUNTER MEDICATION Vitamin b 12 sl daily  . rosuvastatin (CRESTOR) 5 MG tablet Take 1 tablet (5 mg total) by mouth daily.  . [DISCONTINUED] metoprolol succinate (TOPROL-XL) 25 MG 24 hr tablet Take 1 tablet (25 mg total) by mouth daily. Take with or immediately following a meal.     Allergies:   Penicillins and Coconut oil   Social History   Socioeconomic History  . Marital status: Single    Spouse name: Not on file  . Number of children: Not on file  . Years of education: Not on file  . Highest  education level: Not on file  Occupational History  . Not on file  Tobacco Use  . Smoking status: Never Smoker  . Smokeless tobacco: Never Used  Vaping Use  . Vaping Use: Never used  Substance and Sexual Activity  . Alcohol use: No  . Drug use: No  . Sexual activity: Not on file  Other Topics Concern  . Not on file  Social History Narrative  . Not on file   Social Determinants of Health   Financial Resource Strain: Not on file  Food Insecurity: Not on file  Transportation Needs: Not on file  Physical Activity: Not on file  Stress: Not on file  Social Connections: Not on file     Family History: The patient's family history is not on file.  ROS:   Please see the history of present illness.     All other systems reviewed and are negative.  EKGs/Labs/Other Studies Reviewed:    The following studies were reviewed today:  Heart monitor 07/14/20: Monitor shows short runs of NSVT <5 beats and a longer run of PSVT. This may explain palpitations/tachycardia.   Echo 07/14/20: 1. Left ventricular ejection fraction, by estimation, is 60 to 65%. The  left ventricle has normal function. The left ventricle has no regional  wall motion abnormalities. There is mild concentric left ventricular  hypertrophy. Left ventricular diastolic  parameters are consistent with Grade II diastolic dysfunction  (pseudonormalization).  2. Right ventricular systolic function is normal. The right ventricular  size is normal. There is normal pulmonary artery systolic pressure.  3. The mitral valve is normal in structure. No evidence of mitral valve  regurgitation. No evidence of mitral stenosis.  4. Tricuspid valve regurgitation is mild to moderate.  5. The aortic valve is normal in structure. Aortic valve regurgitation is  not visualized. No aortic stenosis is present.  6. The inferior vena cava is normal in size with greater than 50%  respiratory variability, suggesting right atrial  pressure of 3 mmHg.    EKG:  EKG is  ordered today.  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  Recent Labs: 07/13/2020: ALT 17; BUN 24; Creatinine, Ser 1.10; Hemoglobin 13.5; Platelets 345; Potassium 4.2; Sodium 139 07/14/2020: Magnesium 1.8; TSH 2.380  Recent Lipid Panel    Component Value Date/Time   CHOL 191 07/14/2020 0942   TRIG 75 07/14/2020 0942   HDL 48 07/14/2020 0942   CHOLHDL 4.0 07/14/2020 0942   VLDL 15 07/14/2020 0942   LDLCALC 128 (H) 07/14/2020 0942    Physical Exam:    VS:  BP 130/80 (BP Location: Left Arm, Patient Position: Sitting, Cuff Size: Normal)   Pulse 72   Ht 5\' 8"  (1.727 m)   Wt 200 lb 12.8 oz (91.1 kg)   BMI 30.53 kg/m     Wt Readings from Last  3 Encounters:  08/12/20 200 lb 12.8 oz (91.1 kg)  07/13/20 190 lb (86.2 kg)  01/01/20 193 lb 14.4 oz (88 kg)     GEN: Well nourished, well developed in no acute distress HEENT: Normal NECK: No JVD; No carotid bruits LYMPHATICS: No lymphadenopathy CARDIAC: RRR, no murmurs, rubs, gallops RESPIRATORY:  Clear to auscultation without rales, wheezing or rhonchi  ABDOMEN: Soft, non-tender, non-distended MUSCULOSKELETAL:  No edema; No deformity  SKIN: Warm and dry NEUROLOGIC:  Alert and oriented x 3 PSYCHIATRIC:  Normal affect   ASSESSMENT:    1. Tachycardia, unspecified   2. Palpitations   3. NSVT (nonsustained ventricular tachycardia) ([place])   4. PSVT (paroxysmal supraventricular tachycardia) ([place])   5. Other secondary hypertension   6. Hyperlipidemia, unspecified hyperlipidemia type   7. Grade II diastolic dysfunction   8. Medication management   9. Chest pain of uncertain etiology   10. Precordial pain    PLAN:    In order of problems listed above:  Palpitations NSVT PSVT - taking 25 mg toprol - increase toprol to 50 mg - question an anxiety component and she agrees   Chest pain - chest pain with typical and atypical features - CTA during recent  hospitalization did not note coronary atherosclerosis - does have a strong family history of heart disease and her sister just had PCI with stents at age 26 - will obtain nuclear stress test - I added low dose amlodipine for BP and anti-anginal effects   Hypertension - lisinopril-HCTZ - BP is much better today - will collect BMP with ACEI-HCTZ - I will add low dose amlodipine   Grade 2 DD - important to control BP - no signs of heart failure   Prediabetes  - A1c 6.0% - monitor   Hyperlipidemia 07/14/2020: Cholesterol 191; HDL 48; LDL Cholesterol 128; Triglycerides 75; VLDL 15 - given family history and pre-DM, will start 5 mg crestor - baseline LFTs normal in ER   Follow up with me soonest available, earlier if NST abnormal.   Medication Adjustments/Labs and Tests Ordered: Current medicines are reviewed at length with the patient today.  Concerns regarding medicines are outlined above.  Orders Placed This Encounter  Procedures  . Basic metabolic panel  . MYOCARDIAL PERFUSION IMAGING  . EKG 12-Lead   Meds ordered this encounter  Medications  . metoprolol succinate (TOPROL-XL) 50 MG 24 hr tablet    Sig: Take 1 tablet (50 mg total) by mouth daily. Take with or immediately following a meal.    Dispense:  30 tablet    Refill:  6  . amLODipine (NORVASC) 5 MG tablet    Sig: Take 1 tablet (5 mg total) by mouth daily.    Dispense:  180 tablet    Refill:  3  . rosuvastatin (CRESTOR) 5 MG tablet    Sig: Take 1 tablet (5 mg total) by mouth daily.    Dispense:  30 tablet    Refill:  6    Signed, Ledora Bottcher, [place]  08/12/2020 3:13 PM    [facility]

## 2020-08-12 ENCOUNTER — Other Ambulatory Visit: Payer: Self-pay

## 2020-08-12 ENCOUNTER — Ambulatory Visit (INDEPENDENT_AMBULATORY_CARE_PROVIDER_SITE_OTHER): Payer: Medicaid Other | Admitting: Physician Assistant

## 2020-08-12 ENCOUNTER — Encounter: Payer: Self-pay | Admitting: Physician Assistant

## 2020-08-12 VITALS — BP 130/80 | HR 72 | Ht 68.0 in | Wt 200.8 lb

## 2020-08-12 DIAGNOSIS — I5189 Other ill-defined heart diseases: Secondary | ICD-10-CM

## 2020-08-12 DIAGNOSIS — R079 Chest pain, unspecified: Secondary | ICD-10-CM

## 2020-08-12 DIAGNOSIS — I472 Ventricular tachycardia: Secondary | ICD-10-CM | POA: Diagnosis not present

## 2020-08-12 DIAGNOSIS — I158 Other secondary hypertension: Secondary | ICD-10-CM

## 2020-08-12 DIAGNOSIS — Z79899 Other long term (current) drug therapy: Secondary | ICD-10-CM

## 2020-08-12 DIAGNOSIS — I471 Supraventricular tachycardia: Secondary | ICD-10-CM | POA: Diagnosis not present

## 2020-08-12 DIAGNOSIS — R000 Tachycardia, unspecified: Secondary | ICD-10-CM | POA: Diagnosis not present

## 2020-08-12 DIAGNOSIS — E785 Hyperlipidemia, unspecified: Secondary | ICD-10-CM

## 2020-08-12 DIAGNOSIS — R002 Palpitations: Secondary | ICD-10-CM

## 2020-08-12 DIAGNOSIS — I4729 Other ventricular tachycardia: Secondary | ICD-10-CM

## 2020-08-12 DIAGNOSIS — R072 Precordial pain: Secondary | ICD-10-CM

## 2020-08-12 MED ORDER — METOPROLOL SUCCINATE ER 50 MG PO TB24: 50.0000 mg | ORAL_TABLET | Freq: Every day | ORAL | 6 refills | Status: DC

## 2020-08-12 MED ORDER — AMLODIPINE BESYLATE 5 MG PO TABS: 5.0000 mg | ORAL_TABLET | Freq: Every day | ORAL | 3 refills | Status: DC

## 2020-08-12 MED ORDER — ROSUVASTATIN CALCIUM 5 MG PO TABS: 5.0000 mg | ORAL_TABLET | Freq: Every day | ORAL | 6 refills | Status: DC

## 2020-08-12 NOTE — Patient Instructions (Addendum)
Medication Instructions:  INCREASE METOPROLOL 50MG  DAILY  START AMLODIPINE 5MG  IN THE AFTERNOON; MAY CUT IN HALF FOR DIZZINESS  START ROSUVASTATIN 5MG  IN THE EVENING  START MAGOX 400MG  DAILY *If you need a refill on your cardiac medications before your next appointment, please call your pharmacy*  Lab Work: BMET TODAY If you have labs (blood work) drawn today and your tests are completely normal, you will receive your results only by:  MyChart Message (if you have MyChart) OR A paper copy in the mail.  If you have any lab test that is abnormal or we need to change your treatment, we will call you to review the results. You may go to any [facility] that is convenient for you however, we do have a lab in our office that is able to assist you. You DO NOT need an appointment for our lab. The lab is open 8:00am and closes at 4:00pm. Lunch 12:45 - 1:45pm.  Testing/Procedures: Your physician has requested that you have a lexiscan myoview. For further information please visit [email or web address]. Please follow instruction sheet, as given.  Follow-Up: Your next appointment:  10-24-2019 AT 2:15PM In Person with [name], PA-C.  WE WILL CALL YOU WITH [place].  At [facility], you and your health needs are our priority.  As part of our continuing mission to provide you with exceptional heart care, we have created designated Provider Care Teams.  These Care Teams include your primary Cardiologist (physician) and Advanced Practice Providers (APPs -  Physician Assistants and Nurse Practitioners) who all work together to provide you with the care you need, when you need it.

## 2020-08-13 LAB — BASIC METABOLIC PANEL
BUN/Creatinine Ratio: 17 (ref 9–23)
BUN: 15 mg/dL (ref 6–24)
CO2: 23 mmol/L (ref 20–29)
Calcium: 9.3 mg/dL (ref 8.7–10.2)
Chloride: 103 mmol/L (ref 96–106)
Creatinine, Ser: 0.86 mg/dL (ref 0.57–1.00)
Glucose: 88 mg/dL (ref 65–99)
Potassium: 4.4 mmol/L (ref 3.5–5.2)
Sodium: 141 mmol/L (ref 134–144)
eGFR: 85 mL/min/{1.73_m2} (ref >59)

## 2020-08-14 ENCOUNTER — Telehealth (HOSPITAL_COMMUNITY): Payer: Self-pay | Admitting: *Deleted

## 2020-08-14 ENCOUNTER — Telehealth: Payer: Self-pay

## 2020-08-14 NOTE — Telephone Encounter (Signed)
Follow Up:    Pt is returning Michelle's call from today.  

## 2020-08-14 NOTE — Telephone Encounter (Signed)
[id number] 

## 2020-08-14 NOTE — Telephone Encounter (Signed)
Follow up:    Patient returning a call back to get instructions concerning a apt tomorrow. Patient is at work and asked if the instruction can be left on her VM.

## 2020-08-14 NOTE — Telephone Encounter (Signed)
-----   Message from Ledora Bottcher, [place] sent at 08/14/2020 12:40 PM EDT ----- I have discussed this patient with Dr. Debara Pickett. She is getting a nuclear stress test tomorrow. 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   [place], can you please schedule her for an EP consult?   Sharyn Lull, can you please let her know I'm sending her to EP and they will be calling? I tried to call her, but didn't get through.   Thanks so much! Angie

## 2020-08-14 NOTE — Telephone Encounter (Signed)
Close encounter 

## 2020-08-14 NOTE — Telephone Encounter (Signed)
Spoke with patient about prep for [employer].  Instructions given NPO 3 hours prior to test, may have water. No caffeine products 12 hours prior to test.  Wear comfortable clothes.  Patient appreciative of return call.

## 2020-08-15 ENCOUNTER — Ambulatory Visit (HOSPITAL_COMMUNITY)
Admission: RE | Admit: 2020-08-15 | Discharge: 2020-08-15 | Disposition: A | Discharge status: Home / Self Care | Payer: Medicaid Other | Source: Ambulatory Visit | Attending: Cardiovascular Disease | Admitting: Cardiovascular Disease

## 2020-08-15 ENCOUNTER — Other Ambulatory Visit: Payer: Self-pay

## 2020-08-15 ENCOUNTER — Telehealth: Payer: Self-pay

## 2020-08-15 DIAGNOSIS — R079 Chest pain, unspecified: Secondary | ICD-10-CM | POA: Insufficient documentation

## 2020-08-15 DIAGNOSIS — I471 Supraventricular tachycardia, unspecified: Secondary | ICD-10-CM

## 2020-08-15 DIAGNOSIS — I4729 Other ventricular tachycardia: Secondary | ICD-10-CM

## 2020-08-15 DIAGNOSIS — R072 Precordial pain: Secondary | ICD-10-CM | POA: Diagnosis present

## 2020-08-15 DIAGNOSIS — I472 Ventricular tachycardia: Secondary | ICD-10-CM

## 2020-08-15 DIAGNOSIS — R000 Tachycardia, unspecified: Secondary | ICD-10-CM

## 2020-08-15 DIAGNOSIS — R002 Palpitations: Secondary | ICD-10-CM

## 2020-08-15 LAB — MYOCARDIAL PERFUSION IMAGING
LV dias vol: 110 mL (ref 46–106)
LV sys vol: 38 mL
Peak HR: 106 {beats}/min
Rest HR: 64 {beats}/min
SDS: 1
SRS: 1
SSS: 2
TID: 0.97

## 2020-08-15 MED ORDER — TECHNETIUM TC 99M TETROFOSMIN IV KIT
27.8000 | PACK | Freq: Once | INTRAVENOUS | Status: AC | PRN
  Administered 2020-08-15: 27.8 via INTRAVENOUS
  Filled 2020-08-15: qty 28

## 2020-08-15 MED ORDER — TECHNETIUM TC 99M TETROFOSMIN IV KIT
10.1000 | PACK | Freq: Once | INTRAVENOUS | Status: AC | PRN
  Administered 2020-08-15: 10.1 via INTRAVENOUS
  Filled 2020-08-15: qty 11

## 2020-08-15 MED ORDER — REGADENOSON 0.4 MG/5ML IV SOLN
0.4000 mg | Freq: Once | INTRAVENOUS | Status: AC
  Administered 2020-08-15: 0.4 mg via INTRAVENOUS

## 2020-08-15 NOTE — Telephone Encounter (Signed)
-----   Message from Ledora Bottcher, [place] sent at 08/14/2020 12:40 PM EDT ----- I have discussed this patient with Dr. Debara Pickett. She is getting a nuclear stress test tomorrow. 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   [place], can you please schedule her for an EP consult?   Sharyn Lull, can you please let her know I'm sending her to EP and they will be calling? I tried to call her, but didn't get through.   Thanks so much! Angie

## 2020-08-15 NOTE — Telephone Encounter (Signed)
Referral entered for EP

## 2020-08-29 ENCOUNTER — Ambulatory Visit: Payer: Medicaid Other | Admitting: Internal Medicine

## 2020-08-29 ENCOUNTER — Encounter: Payer: Self-pay | Admitting: Internal Medicine

## 2020-08-29 ENCOUNTER — Other Ambulatory Visit: Payer: Self-pay

## 2020-08-29 DIAGNOSIS — I471 Supraventricular tachycardia, unspecified: Secondary | ICD-10-CM | POA: Insufficient documentation

## 2020-08-29 NOTE — Patient Instructions (Signed)
Medication Instructions:  Your physician recommends that you continue on your current medications as directed. Please refer to the Current Medication list given to you today.  Labwork: None ordered.  Testing/Procedures: Your physician has recommended that you have an ablation. Catheter ablation is a medical procedure used to treat some cardiac arrhythmias (irregular heartbeats). During catheter ablation, a long, thin, flexible tube is put into a blood vessel in your groin (upper thigh), or neck. This tube is called an ablation catheter. It is then guided to your heart through the blood vessel. Radio frequency waves destroy small areas of heart tissue where abnormal heartbeats may cause an arrhythmia to start. Please see the instruction sheet given to you today.  Follow-Up:  The following dates are available for this procedure:  July 18, 25 August 8, 11, 15, 22, 26, 29  Any Other Special Instructions Will Be Listed Below (If Applicable).  If you need a refill on your cardiac medications before your next appointment, please call your pharmacy.   Cardiac electrophysiology: From cell to bedside (7th ed., pp. 2774-1287). [place], [place]: Elsevier.">  Cardiac Ablation Cardiac ablation is a procedure to destroy, or ablate, a small amount of heart tissue in very specific places. The heart has many electrical connections. Sometimes these connections are abnormal and can cause the heart to beat very fast or irregularly. Ablating some of the areas that cause problems can improve the heart's rhythm or return it to normal. Ablation may be done for people who: Have Wolff-Parkinson-White syndrome. Have fast heart rhythms (tachycardia). Have taken medicines for an abnormal heart rhythm (arrhythmia) that were not effective or caused side effects. Have a high-risk heartbeat that may be life-threatening. During the procedure, a small incision is made in the neck or the groin, and a long, thin tube  (catheter) is inserted into the incision and moved to the heart. Small devices (electrodes) on the tip of the catheter will send out electrical currents. A type of X-ray (fluoroscopy) will be used to help guide the catheter and to provide images of the heart. Tell a health care provider about: Any allergies you have. All medicines you are taking, including vitamins, herbs, eye drops, creams, and over-the-counter medicines. Any problems you or family members have had with anesthetic medicines. Any blood disorders you have. Any surgeries you have had. Any medical conditions you have, such as kidney failure. Whether you are pregnant or may be pregnant. What are the risks? Generally, this is a safe procedure. However, problems may occur, including: Infection. Bruising and bleeding at the catheter insertion site. Bleeding into the chest, especially into the sac that surrounds the heart. This is a serious complication. Stroke or blood clots. Damage to nearby structures or organs. Allergic reaction to medicines or dyes. Need for a permanent pacemaker if the normal electrical system is damaged. A pacemaker is a small computer that sends electrical signals to the heart and helps your heart beat normally. The procedure not being fully effective. This may not be recognized until months later. Repeat ablation procedures are sometimes done. What happens before the procedure? Medicines Ask your health care provider about: Changing or stopping your regular medicines. This is especially important if you are taking diabetes medicines or blood thinners. Taking medicines such as aspirin and ibuprofen. These medicines can thin your blood. Do not take these medicines unless your health care provider tells you to take them. Taking over-the-counter medicines, vitamins, herbs, and supplements. General instructions Follow instructions from your health care provider about  eating or drinking restrictions. Plan to  have someone take you home from the hospital or clinic. If you will be going home right after the procedure, plan to have someone with you for 24 hours. Ask your health care provider what steps will be taken to prevent infection. What happens during the procedure?  An IV will be inserted into one of your veins. You will be given a medicine to help you relax (sedative). The skin on your neck or groin will be numbed. An incision will be made in your neck or your groin. A needle will be inserted through the incision and into a large vein in your neck or groin. A catheter will be inserted into the needle and moved to your heart. Dye may be injected through the catheter to help your surgeon see the area of the heart that needs treatment. Electrical currents will be sent from the catheter to ablate heart tissue in desired areas. There are three types of energy that may be used to do this: Heat (radiofrequency energy). Laser energy. Extreme cold (cryoablation). When the tissue has been ablated, the catheter will be removed. Pressure will be held on the insertion area to prevent a lot of bleeding. A bandage (dressing) will be placed over the insertion area. The exact procedure may vary among health care providers and hospitals. What happens after the procedure? Your blood pressure, heart rate, breathing rate, and blood oxygen level will be monitored until you leave the hospital or clinic. Your insertion area will be monitored for bleeding. You will need to lie still for a few hours to ensure that you do not bleed from the insertion area. Do not drive for 24 hours or as long as told by your health care provider. Summary Cardiac ablation is a procedure to destroy, or ablate, a small amount of heart tissue using an electrical current. This procedure can improve the heart rhythm or return it to normal. Tell your health care provider about any medical conditions you may have and all medicines you are  taking to treat them. This is a safe procedure, but problems may occur. Problems may include infection, bruising, damage to nearby organs or structures, or allergic reactions to medicines. Follow your health care provider's instructions about eating and drinking before the procedure. You may also be told to change or stop some of your medicines. After the procedure, do not drive for 24 hours or as long as told by your health care provider. This information is not intended to replace advice given to you by your health care provider. Make sure you discuss any questions you have with your healthcare provider. Document Revised: 01/15/2019 Document Reviewed: 01/15/2019 Elsevier Patient Education  [place].

## 2020-08-29 NOTE — Progress Notes (Signed)
HPI Nancy Davis is referred today by [name], PA-C for evaluation of SVT. 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 She has a h/o syncope but none in a couple of years. She has been on toprol. She also has HTN. She also has dyslipidemia. She states that her heart racing has been present about 4 years. When she is in tachycardia she denies chest pain or sob. She notes that exertion will bring her spells on.  Allergies  Allergen Reactions   Penicillins Anaphylaxis   Coconut Oil     hives     Current Outpatient Medications  Medication Sig Dispense Refill   acetaminophen (TYLENOL) 500 MG tablet Take 500 mg by mouth every 6 (six) hours as needed.     amLODipine (NORVASC) 5 MG tablet Take 1 tablet (5 mg total) by mouth daily. 180 tablet 3   diclofenac Sodium (VOLTAREN) 1 % GEL Apply 2 g topically 4 (four) times daily as needed (knee pain). 50 g 0   lisinopril-hydrochlorothiazide (ZESTORETIC) 10-12.5 MG tablet Take 1 tablet by mouth daily. 30 tablet 1   metoprolol succinate (TOPROL-XL) 50 MG 24 hr tablet Take 1 tablet (50 mg total) by mouth daily. Take with or immediately following a meal. 30 tablet 6   OVER THE COUNTER MEDICATION Vitamin b 12 sl daily     rosuvastatin (CRESTOR) 5 MG tablet Take 1 tablet (5 mg total) by mouth daily. 30 tablet 6   No current facility-administered medications for this visit.     Past Medical History:  Diagnosis Date   Anemia    yrs ago   Dislocation of left knee with medial meniscus tear    Hypertension    Knee pain, left    from old injury playing sports in high school,     ROS:   All systems reviewed and negative except as noted in the HPI.   Past Surgical History:  Procedure Laterality Date   CESAREAN SECTION  2007   KNEE ARTHROSCOPY WITH MEDIAL MENISECTOMY Right 01/01/2020   Procedure: KNEE ARTHROSCOPY WITH MEDIAL MENISECTOMY and chrondroplasty;  Surgeon: Renette Butters, MD;   Location: [facility];  Service: Orthopedics;  Laterality: Right;     History reviewed. No pertinent family history.   Social History   Socioeconomic History   Marital status: Single    Spouse name: Not on file   Number of children: Not on file   Years of education: Not on file   Highest education level: Not on file  Occupational History   Not on file  Tobacco Use   Smoking status: Never   Smokeless tobacco: Never  Vaping Use   Vaping Use: Never used  Substance and Sexual Activity   Alcohol use: No   Drug use: No   Sexual activity: Not on file  Other Topics Concern   Not on file  Social History Narrative   Not on file   Social Determinants of Health   Financial Resource Strain: Not on file  Food Insecurity: Not on file  Transportation Needs: Not on file  Physical Activity: Not on file  Stress: Not on file  Social Connections: Not on file  Intimate Partner Violence: Not on file     BP 130/80   Pulse (!) 58   Ht 5\' 9"  (1.753 m)   Wt 196 lb 12.8 oz (89.3 kg)   SpO2 99%   BMI 29.06 kg/m  Physical Exam:  Well appearing NAD HEENT: Unremarkable Neck:  No JVD, no thyromegally Lymphatics:  No adenopathy Back:  No CVA tenderness Lungs:  Clear with no wheezes HEART:  Regular rate rhythm, no murmurs, no rubs, no clicks Abd:  soft, positive bowel sounds, no organomegally, no rebound, no guarding Ext:  2 plus pulses, no edema, no cyanosis, no clubbing Skin:  No rashes no nodules Neuro:  CN II through XII intact, motor grossly intact  EKG - reviewed. 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 I have discussed the treatment options with her symptoms, documented pre-excitation and documented SVT, I have recommended proceeding with catheter ablation.  HTN - once she has undergone ablation, we will consider changing her bp meds.  Dyslipidemia - she will continue crestor.   Carleene Overlie Johannes Everage,MD

## 2020-09-10 NOTE — Telephone Encounter (Signed)
Patient was calling to set up the EP appt explain that Doylene Canning is out of office till Monday and will call back then.

## 2020-09-15 ENCOUNTER — Telehealth: Payer: Self-pay | Admitting: Internal Medicine

## 2020-09-15 DIAGNOSIS — I471 Supraventricular tachycardia, unspecified: Secondary | ICD-10-CM

## 2020-09-15 NOTE — Telephone Encounter (Incomplete)
Follow up:       Patient stating that she will do the 10/27/2020 overnight apt.

## 2020-09-26 ENCOUNTER — Encounter (HOSPITAL_COMMUNITY): Payer: Self-pay | Admitting: Emergency Medicine

## 2020-09-26 ENCOUNTER — Other Ambulatory Visit: Payer: Self-pay

## 2020-09-26 ENCOUNTER — Emergency Department (HOSPITAL_COMMUNITY)
Admission: EM | Admit: 2020-09-26 | Discharge: 2020-09-26 | Disposition: A | Discharge status: Home / Self Care | Payer: Medicaid Other | Attending: Emergency Medicine | Admitting: Emergency Medicine

## 2020-09-26 DIAGNOSIS — R42 Dizziness and giddiness: Secondary | ICD-10-CM | POA: Insufficient documentation

## 2020-09-26 DIAGNOSIS — M25561 Pain in right knee: Secondary | ICD-10-CM | POA: Diagnosis not present

## 2020-09-26 DIAGNOSIS — Z20822 Contact with and (suspected) exposure to covid-19: Secondary | ICD-10-CM | POA: Insufficient documentation

## 2020-09-26 DIAGNOSIS — R079 Chest pain, unspecified: Secondary | ICD-10-CM | POA: Diagnosis not present

## 2020-09-26 DIAGNOSIS — Z79899 Other long term (current) drug therapy: Secondary | ICD-10-CM | POA: Insufficient documentation

## 2020-09-26 DIAGNOSIS — I10 Essential (primary) hypertension: Secondary | ICD-10-CM | POA: Insufficient documentation

## 2020-09-26 LAB — RESP PANEL BY RT-PCR (FLU A&B, COVID) ARPGX2
Influenza A by PCR: NEGATIVE
Influenza B by PCR: NEGATIVE
SARS Coronavirus 2 by RT PCR: NEGATIVE

## 2020-09-26 MED ORDER — KETOROLAC TROMETHAMINE 30 MG/ML IJ SOLN
30.0000 mg | Freq: Once | INTRAMUSCULAR | Status: AC
  Administered 2020-09-26: 30 mg via INTRAMUSCULAR
  Filled 2020-09-26: qty 1

## 2020-09-26 MED ORDER — NAPROXEN 500 MG PO TABS: 500.0000 mg | ORAL_TABLET | Freq: Two times a day (BID) | ORAL | 0 refills | Status: DC

## 2020-09-26 MED ORDER — KETOROLAC TROMETHAMINE 30 MG/ML IJ SOLN: 30.0000 mg | Freq: Once | INTRAMUSCULAR | Status: DC

## 2020-09-26 NOTE — ED Triage Notes (Signed)
Patient complains of right knee pain that started when she fell at work one month ago. Has not had knee evaluated before today. Patient's heart rate 170 in triage. Patient alert, oriented, and in no apparent distress at this time.

## 2020-09-26 NOTE — ED Provider Notes (Signed)
[facility] EMERGENCY DEPARTMENT Provider Note   CSN: [id number] Arrival date & time: 09/26/20  1319     History Chief Complaint  Patient presents with   Knee Pain    Nancy Davis is a 45 y.o. female.  HPI  Patient presents with right knee pain.  Started 1 month ago when she twisted it in an accident at work.  Pain is been constant since then.  Is worse with movement, but not progressing.  She also notes some associated swelling around the kneecap.  She has been taking ibuprofen without relief.  She was seen 06/20/2020 at that time and had radiographic imaging done.  There were no fracture or dislocation.  She has had previous surgery for meniscal repair in October 2021.  During triage, she had some associated chest pain and dizziness when she went in SVT.  She has no history of SVT and takes medication for it.  She sees a cardiologist, Dr. Crissie Sickles, and has a current cardioversion scheduled for August 8 of this year.  The pain is since resolved.  It was typical in its presentation.  There is no nausea or vomiting.  The pain is since resolved when she reverted back into sinus rhythm.  Past Medical History:  Diagnosis Date   Anemia    yrs ago   Dislocation of left knee with medial meniscus tear    Hypertension    Knee pain, left    from old injury playing sports in high school,     Patient Active Problem List   Diagnosis Date Noted   SVT (supraventricular tachycardia) ([place]) 08/29/2020   Elevated heart rate with elevated blood pressure without diagnosis of hypertension 07/14/2020   Microcytosis 07/14/2020   Hypertension    Elevated troponin I level     Past Surgical History:  Procedure Laterality Date   CESAREAN SECTION  2007   KNEE ARTHROSCOPY WITH MEDIAL MENISECTOMY Right 01/01/2020   Procedure: KNEE ARTHROSCOPY WITH MEDIAL MENISECTOMY and chrondroplasty;  Surgeon: Renette Butters, MD;  Location: [place];  Service: Orthopedics;   Laterality: Right;     OB History   No obstetric history on file.     No family history on file.  Social History   Tobacco Use   Smoking status: Never   Smokeless tobacco: Never  Vaping Use   Vaping Use: Never used  Substance Use Topics   Alcohol use: No   Drug use: No    Home Medications Prior to Admission medications   Medication Sig Start Date End Date Taking? Authorizing Provider  acetaminophen (TYLENOL) 500 MG tablet Take 500 mg by mouth every 6 (six) hours as needed.    [provider]  amLODipine (NORVASC) 5 MG tablet Take 1 tablet (5 mg total) by mouth daily. 08/12/20 11/10/20  Ledora Bottcher, PA  diclofenac Sodium (VOLTAREN) 1 % GEL Apply 2 g topically 4 (four) times daily as needed (knee pain). 06/20/20   Long, Wonda Olds, MD  lisinopril-hydrochlorothiazide (ZESTORETIC) 10-12.5 MG tablet Take 1 tablet by mouth daily. 07/14/20 09/26/20  Cherene Altes, MD  metoprolol succinate (TOPROL-XL) 50 MG 24 hr tablet Take 1 tablet (50 mg total) by mouth daily. Take with or immediately following a meal. 08/12/20 11/10/20  [facility], Tami Lin, PA  OVER THE COUNTER MEDICATION Vitamin b 12 sl daily    [provider]  rosuvastatin (CRESTOR) 5 MG tablet Take 1 tablet (5 mg total) by mouth daily. 08/12/20 11/10/20  Ledora Bottcher, PA    Allergies    Penicillins and Coconut oil  Review of Systems   Review of Systems  Cardiovascular:  Positive for chest pain.  Gastrointestinal:  Negative for nausea and vomiting.  Musculoskeletal:  Positive for arthralgias and joint swelling.   Physical Exam Updated Vital Signs BP 130/89   Pulse 70   Temp 99.3 F (37.4 C) (Oral)   Resp 16   SpO2 97%   Physical Exam Vitals and nursing note reviewed. Exam conducted with a chaperone present.  Constitutional:      General: She is not in acute distress.    Appearance: Normal appearance.  HENT:     Head: Normocephalic and atraumatic.  Eyes:     General: No scleral  icterus.       Right eye: No discharge.        Left eye: No discharge.     Extraocular Movements: Extraocular movements intact.     Pupils: Pupils are equal, round, and reactive to light.  Cardiovascular:     Rate and Rhythm: Normal rate and regular rhythm.     Pulses: Normal pulses.     Heart sounds: Normal heart sounds. No murmur heard.   No friction rub. No gallop.     Comments: Patient is in regular rhythm.  S1-S2 without murmurs.  DP and PT are 2+ bilaterally Pulmonary:     Effort: Pulmonary effort is normal. No respiratory distress.     Breath sounds: Normal breath sounds.  Abdominal:     General: Abdomen is flat. Bowel sounds are normal. There is no distension.     Palpations: Abdomen is soft.     Tenderness: There is no abdominal tenderness.  Musculoskeletal:        General: Swelling and signs of injury present. No tenderness or deformity. Normal range of motion.     Comments: ROM intact.  Patient is able to ambulate.  Strength is 4/5 against resistance.  There is some suprapatellar swelling, but no warmth or erythema.  Legs are symmetric bilaterally, there is no posterior calf tenderness on palpation.  Skin:    General: Skin is warm and dry.     Coloration: Skin is not jaundiced.  Neurological:     Mental Status: She is alert. Mental status is at baseline.     Coordination: Coordination normal.    ED Results / Procedures / Treatments   Labs (all labs ordered are listed, but only abnormal results are displayed) Labs Reviewed  RESP PANEL BY RT-PCR (FLU A&B, COVID) [id number]    EKG None  Radiology No results found.  Procedures Procedures   Medications Ordered in ED Medications - No data to display  ED Course  I have reviewed the triage vital signs and the nursing notes.  Pertinent labs & imaging results that were available during my care of the patient were reviewed by me and considered in my medical decision making (see chart for details).    MDM  Rules/Calculators/A&P                          SVT resolved while in triage.  She does not miss any doses of medication, and states that when she is in pain the SVT is usually triggered.  Chest pain has resolved, and it was classic in appearance and typical of her previous episodes of SVT.  I do not suspect this is ACS, MI, pericarditis.  Additionally, the pain  resolved when her SVT went back into sinus rhythm making the symptoms most likely attributable to the SVT.  She has a appointment with her cardiologist on August 8 for a cardioversion.  Advised her to keep that appointment as well as to follow-up with her cardiologist.  The knee pain has been ongoing for over a month now.  She had radiographic imaging done at the time of the injury which does not show dislocation or fracture.  There are some mild swelling due to an effusion, but it is not erythematous or acutely tender to touch.  She is full range of motion and is able to ambulate without difficulty.  I do not suspect a septic joint, she is not febrile and does not have any systemic symptoms such as nausea, vomiting, chills, weakness.  Advised patient to follow-up with orthopedic surgeon.  I also gave additional resources to follow-up with a different orthopedic practice if desired.  We will give her an anti-inflammatory to help with the pain flareup for now, with plans for her to follow-up for further management.  Patient pain improved with a shot of Toradol.  She is agreeable to the plan and voices understanding.  Her vitals are stable, she is able to ambulate and perform daily activities.  She is appropriate for discharge at this time.  I do not believe additional imaging would be helpful.    Final Clinical Impression(s) / ED Diagnoses Final diagnoses:  None    Rx / DC Orders ED Discharge Orders     None        Sherrill Raring, Hershal Coria 09/26/20 1516    Carmin Muskrat, MD 09/27/20 [phone]

## 2020-09-26 NOTE — Discharge Instructions (Addendum)
You are seen today in the ED for knee pain.  I am writing you prescription for naproxen.  This is an anti-inflammatory medicine I would like you to take twice daily for the next week.  Please contact one of the orthopedic surgery groups listed above, or contact her original surgeon to schedule a follow-up appointment for further evaluation and plan of your knee pain.    Please continue take your medicine as prescribed by the cardiologist and follow-up with your cardioversion as scheduled August 8.    If things change or worsen please return back to the ED for further evaluation.

## 2020-09-26 NOTE — ED Provider Notes (Signed)
Emergency Medicine Provider Triage Evaluation Note  MARIZOL BORROR , a 45 y.o. female  was evaluated in triage.  Pt complains of CP, dizziness, knee pain. Given patient's rapid heart rate and complaint of chest pain chest evaluation is cut short to move patient back to major care.  Review of Systems  Positive: Knee pain, chest pain, lightheadedness Negative: Fever  Physical Exam  BP 102/90   Pulse (!) 172   Temp 99.3 F (37.4 C) (Oral)   Resp 20   SpO2 100%  Gen:   Awake, no distress   Resp:  Normal effort  MSK:   Moves extremities without difficulty  Other:  Rapid radial pulse.  Approximately 160-180.  Medical Decision Making  Medically screening exam initiated at 1:37 PM.  Appropriate orders placed.  AMYIA LODWICK was informed that the remainder of the evaluation will be completed by another provider, this initial triage assessment does not replace that evaluation, and the importance of remaining in the ED until their evaluation is complete.  EKG reviewed patient is a rapid rhythm most consistent with SVT no evidence of flutter waves appears quite regular low suspicion for A. fib RVR. Chest pain may very well be rate related. Patient states that she follows with Dr. Cristopher Peru cardiology has been considered for catheterization  Will defer further management and decisions to provider takes care of patient in major care.   Pati Gallo [place], [place] 09/26/20 1342    Valarie Merino, MD 09/28/20 2056

## 2020-10-06 NOTE — Telephone Encounter (Signed)
Spoke with pt and she would like to schedule her ablation for 8/8 if still available.  If not, states she can do 8/11.  Advised I will send information to Dr. Tanna Furry nurse and once she has everything scheduled and instructions completed, she will be in contact with pt.  She's aware it may be a few days before Dr. Tanna Furry nurse calls to go over everything.  Pt appreciative for call.

## 2020-10-06 NOTE — Telephone Encounter (Signed)
Left message to call back  

## 2020-10-06 NOTE — Telephone Encounter (Signed)
Pt is calling back about a procedure that was suppose to be scheduled for 8/8

## 2020-10-07 NOTE — Telephone Encounter (Signed)
Patient is following up, requesting to speak with Dr. Tanna Furry nurse regarding her procedure. I informed her Dr. Tanna Furry nurse sent a message on MyChart and she states she plans to contact her that way.

## 2020-10-08 NOTE — Telephone Encounter (Signed)
Pt scheduled for SVT ablation on October 27, 2020 at 12:00 pm with Dr. Lovena Le.  Will get lab work on 10/09/20 and meet with Pt to go over instructions.  Work up complete

## 2020-10-09 ENCOUNTER — Other Ambulatory Visit: Payer: Self-pay

## 2020-10-09 ENCOUNTER — Other Ambulatory Visit: Payer: Medicaid Other

## 2020-10-09 DIAGNOSIS — I471 Supraventricular tachycardia: Secondary | ICD-10-CM

## 2020-10-09 LAB — CBC WITH DIFFERENTIAL/PLATELET
Basophils Absolute: 0 10*3/uL (ref 0.0–0.2)
Basos: 1 %
EOS (ABSOLUTE): 0.1 10*3/uL (ref 0.0–0.4)
Eos: 1 %
Hematocrit: 33.9 % — ABNORMAL LOW (ref 34.0–46.6)
Hemoglobin: 10.9 g/dL — ABNORMAL LOW (ref 11.1–15.9)
Lymphocytes Absolute: 2.4 10*3/uL (ref 0.7–3.1)
Lymphs: 38 %
MCH: 20.3 pg — ABNORMAL LOW (ref 26.6–33.0)
MCHC: 32.2 g/dL (ref 31.5–35.7)
MCV: 63 fL — ABNORMAL LOW (ref 79–97)
Monocytes Absolute: 0.8 10*3/uL (ref 0.1–0.9)
Monocytes: 12 %
Neutrophils Absolute: 3.1 10*3/uL (ref 1.4–7.0)
Neutrophils: 48 %
Platelets: 269 10*3/uL (ref 150–450)
RBC: 5.37 x10E6/uL — ABNORMAL HIGH (ref 3.77–5.28)
RDW: 17.3 % — ABNORMAL HIGH (ref 11.7–15.4)
WBC: 6.3 10*3/uL (ref 3.4–10.8)

## 2020-10-09 LAB — BASIC METABOLIC PANEL
BUN/Creatinine Ratio: 29 — ABNORMAL HIGH (ref 9–23)
BUN: 20 mg/dL (ref 6–24)
CO2: 26 mmol/L (ref 20–29)
Calcium: 9.6 mg/dL (ref 8.7–10.2)
Chloride: 103 mmol/L (ref 96–106)
Creatinine, Ser: 0.7 mg/dL (ref 0.57–1.00)
Glucose: 82 mg/dL (ref 65–99)
Potassium: 4 mmol/L (ref 3.5–5.2)
Sodium: 138 mmol/L (ref 134–144)
eGFR: 109 mL/min/{1.73_m2} (ref >59)

## 2020-10-22 NOTE — Progress Notes (Signed)
Cardiology Office Note:    Date:  10/23/2020   ID:  OCIA WIDEN, DOB 1976/02/25, MRN ID:[id number]  PCP:  Default, Provider, MD  Cardiologist:  Pixie Casino, MD   Referring MD: No ref. provider found   Chief Complaint  Patient presents with   Follow-up    Chest pain    History of Present Illness:    Nancy Davis is a 45 y.o. female with a hx of hypertension and tachycardia. She was initially diagnosed with both in [place], but stopped taking medications over 20 years ago. Hx of lisinopril and HCTZ along with ?BB. She presented to the ER 07/14/20 with tachycardia and chest pain. 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 She had chest discomfort with this, which resolved with resolution of her tachycardia. Echo in the ER with normal EF, but evidence of hypertensive heart disease with elevated LVEDP and grade 2 DD, mild to moderate TR. CTA negative for PE and did not show aortic atherosclerosis or coronary calcifications. Heart monitor was placed and she was restarted on lisinopril-HCTZ. Toprol was also added.   Heart monitor showed short runs of NSVT (< 5 beats) and PSVT. I saw her in follow up 08/13/20. She continued to have palpitations with EKG consistent with WPW and I referred her to EP. Dr. Lovena Le is planning SVT ablation on 10/27/20. Chest pain felt atypical. CTA in hospital did not note coronary atherosclerosis. She has a strong family history of heart disease - sister had PCI at age 24. Given this, I ordered a nuclear stress test that was negative for ischemia.   She returns for general cardiology follow up. She has reduced fried foods and cut out coffee, sodas, and tea. She has started a walking program and is losing weight. She has had a dramatic improvement in her chest discomfort. Overall she is doing very well. Palpitations have reduced. Her mom is with her today for her visit.    Past  Medical History:  Diagnosis Date   Anemia    yrs ago   Dislocation of left knee with medial meniscus tear    Hypertension    Knee pain, left    from old injury playing sports in high school,     Past Surgical History:  Procedure Laterality Date   CESAREAN SECTION  2007   KNEE ARTHROSCOPY WITH MEDIAL MENISECTOMY Right 01/01/2020   Procedure: KNEE ARTHROSCOPY WITH MEDIAL MENISECTOMY and chrondroplasty;  Surgeon: Renette Butters, MD;  Location: [facility];  Service: Orthopedics;  Laterality: Right;    Current Medications: Current Meds  Medication Sig   acetaminophen (TYLENOL) 500 MG tablet Take 500 mg by mouth every 6 (six) hours as needed.   amLODipine (NORVASC) 5 MG tablet Take 1 tablet (5 mg total) by mouth daily.   diclofenac Sodium (VOLTAREN) 1 % GEL Apply 2 g topically 4 (four) times daily as needed (knee pain).   metoprolol succinate (TOPROL-XL) 50 MG 24 hr tablet Take 1 tablet (50 mg total) by mouth daily. Take with or immediately following a meal.   naproxen (NAPROSYN) 500 MG tablet Take 1 tablet (500 mg total) by mouth 2 (two) times daily.   OVER THE COUNTER MEDICATION Vitamin b 12 sl daily   rosuvastatin (CRESTOR) 5 MG tablet Take 1 tablet (5 mg total) by mouth daily.     Allergies:   Penicillins and Coconut oil  Social History   Socioeconomic History   Marital status: Single    Spouse name: Not on file   Number of children: Not on file   Years of education: Not on file   Highest education level: Not on file  Occupational History   Not on file  Tobacco Use   Smoking status: Never   Smokeless tobacco: Never  Vaping Use   Vaping Use: Never used  Substance and Sexual Activity   Alcohol use: No   Drug use: No   Sexual activity: Not on file  Other Topics Concern   Not on file  Social History Narrative   Not on file   Social Determinants of Health   Financial Resource Strain: Not on file  Food Insecurity: Not on file  Transportation  Needs: Not on file  Physical Activity: Not on file  Stress: Not on file  Social Connections: Not on file     Family History: The patient's family history is not on file.  ROS:   Please see the history of present illness.     All other systems reviewed and are negative.  EKGs/Labs/Other Studies Reviewed:    The following studies were reviewed today:  Heart monitor 07/14/20: Monitor shows short runs of NSVT <5 beats and a longer run of PSVT. This may explain palpitations/tachycardia.     Echo 07/14/20:  1. Left ventricular ejection fraction, by estimation, is 60 to 65%. The  left ventricle has normal function. The left ventricle has no regional  wall motion abnormalities. There is mild concentric left ventricular  hypertrophy. Left ventricular diastolic  parameters are consistent with Grade II diastolic dysfunction  (pseudonormalization).   2. Right ventricular systolic function is normal. The right ventricular  size is normal. There is normal pulmonary artery systolic pressure.   3. The mitral valve is normal in structure. No evidence of mitral valve  regurgitation. No evidence of mitral stenosis.   4. Tricuspid valve regurgitation is mild to moderate.   5. The aortic valve is normal in structure. Aortic valve regurgitation is  not visualized. No aortic stenosis is present.   6. The inferior vena cava is normal in size with greater than 50%  respiratory variability, suggesting right atrial pressure of 3 mmHg.  EKG:  EKG is not ordered today.    Recent Labs: 07/13/2020: ALT 17 07/14/2020: Magnesium 1.8; TSH 2.380 10/09/2020: BUN 20; Creatinine, Ser 0.70; Hemoglobin 10.9; Platelets 269; Potassium 4.0; Sodium 138  Recent Lipid Panel    Component Value Date/Time   CHOL 191 07/14/2020 0942   TRIG 75 07/14/2020 0942   HDL 48 07/14/2020 0942   CHOLHDL 4.0 07/14/2020 0942   VLDL 15 07/14/2020 0942   LDLCALC 128 (H) 07/14/2020 0942    Physical Exam:    VS:  BP (!) 144/88    Pulse 63   Ht '5\' 9"'$  (1.753 m)   Wt 194 lb 3.2 oz (88.1 kg)   SpO2 100%   BMI 28.68 kg/m     Wt Readings from Last 3 Encounters:  10/23/20 194 lb 3.2 oz (88.1 kg)  08/29/20 196 lb 12.8 oz (89.3 kg)  08/15/20 200 lb (90.7 kg)     GEN:  Well nourished, well developed in no acute distress HEENT: Normal NECK: No JVD; No carotid bruits LYMPHATICS: No lymphadenopathy CARDIAC: RRR, no murmurs, rubs, gallops RESPIRATORY:  Clear to auscultation without rales, wheezing or rhonchi  ABDOMEN: Soft, non-tender, non-distended MUSCULOSKELETAL:  No edema; No deformity  SKIN: Warm and  dry NEUROLOGIC:  Alert and oriented x 3 PSYCHIATRIC:  Normal affect   ASSESSMENT:    1. Palpitations   2. SVT (supraventricular tachycardia) ([place])   3. NSVT (nonsustained ventricular tachycardia) ([place])   4. WPW (Wolff-Parkinson-White syndrome)   5. Grade II diastolic dysfunction   6. Primary hypertension   7. Hyperlipidemia, unspecified hyperlipidemia type    PLAN:    In order of problems listed above:  Palpitations WPW, NSVT, PSVT - taking 50 mg toprol - ablation with Dr. Lovena Le 10/27/20   Hypertension - lisinopril-HCTZ, amlodipine (added last visit) - BP today is 144/88 - this is generally what she runs at home - will increase amlodipine to 10 mg   Grade 2 DD - important to control BP - no signs of heart failure   Prediabetes  - A1c 6.0% - will need to monitor   Hyperlipidemia with LDL goal < 70 07/14/2020: Cholesterol 191; HDL 48; LDL Cholesterol 128; Triglycerides 75; VLDL 15 Given family history of premature heart disease, I started 5 mg crestor at last visit She is tolerating this well. Will collect fating lipids. May need to increase crestor to 10 mg.   Follow up with me or Dr. Debara Pickett in 3 months.   Medication Adjustments/Labs and Tests Ordered: Current medicines are reviewed at length with the patient today.  Concerns regarding medicines are outlined above.  No orders of the  defined types were placed in this encounter.  No orders of the defined types were placed in this encounter.   Signed, Ledora Bottcher, PA  10/23/2020 2:52 PM    [facility]

## 2020-10-23 ENCOUNTER — Other Ambulatory Visit: Payer: Self-pay

## 2020-10-23 ENCOUNTER — Ambulatory Visit: Payer: Medicaid Other | Admitting: Physician Assistant

## 2020-10-23 ENCOUNTER — Encounter: Payer: Self-pay | Admitting: Physician Assistant

## 2020-10-23 VITALS — BP 144/88 | HR 63 | Ht 69.0 in | Wt 194.2 lb

## 2020-10-23 DIAGNOSIS — I10 Essential (primary) hypertension: Secondary | ICD-10-CM

## 2020-10-23 DIAGNOSIS — I472 Ventricular tachycardia: Secondary | ICD-10-CM

## 2020-10-23 DIAGNOSIS — I5189 Other ill-defined heart diseases: Secondary | ICD-10-CM

## 2020-10-23 DIAGNOSIS — R002 Palpitations: Secondary | ICD-10-CM | POA: Diagnosis not present

## 2020-10-23 DIAGNOSIS — I471 Supraventricular tachycardia: Secondary | ICD-10-CM

## 2020-10-23 DIAGNOSIS — E785 Hyperlipidemia, unspecified: Secondary | ICD-10-CM

## 2020-10-23 DIAGNOSIS — I4729 Other ventricular tachycardia: Secondary | ICD-10-CM

## 2020-10-23 DIAGNOSIS — I456 Pre-excitation syndrome: Secondary | ICD-10-CM | POA: Insufficient documentation

## 2020-10-23 MED ORDER — AMLODIPINE BESYLATE 10 MG PO TABS: 10.0000 mg | ORAL_TABLET | Freq: Every day | ORAL | 3 refills | Status: DC

## 2020-10-23 NOTE — Patient Instructions (Signed)
Medication Instructions:  INCREASE Amlodipine to 10 mg daily  *If you need a refill on your cardiac medications before your next appointment, please call your pharmacy*  Lab Work: Your physician recommends that you return for a FASTING lipid profile in 1 week:    If you have labs (blood work) drawn today and your tests are completely normal, you will receive your results only by: [facility] (if you have MyChart) OR A paper copy in the mail If you have any lab test that is abnormal or we need to change your treatment, we will call you to review the results.  Testing/Procedures: NONE ordered at this time of appointment   Follow-Up: At [facility], you and your health needs are our priority.  As part of our continuing mission to provide you with exceptional heart care, we have created designated Provider Care Teams.  These Care Teams include your primary Cardiologist (physician) and Advanced Practice Providers (APPs -  Physician Assistants and Nurse Practitioners) who all work together to provide you with the care you need, when you need it.  We recommend signing up for the patient portal called "MyChart".  Sign up information is provided on this After Visit Summary.  MyChart is used to connect with patients for Virtual Visits (Telemedicine).  Patients are able to view lab/test results, encounter notes, upcoming appointments, etc.  Non-urgent messages can be sent to your provider as well.   To learn more about what you can do with MyChart, go to [email or web address].    Your next appointment:   3 month(s)  The format for your next appointment:   In Person  Provider:   [name], PA-C  Other Instructions

## 2020-10-27 ENCOUNTER — Ambulatory Visit (HOSPITAL_COMMUNITY)
Admission: RE | Admit: 2020-10-27 | Discharge: 2020-10-28 | Disposition: A | Discharge status: Home / Self Care | Payer: Medicaid Other | Attending: Internal Medicine | Admitting: Internal Medicine

## 2020-10-27 ENCOUNTER — Other Ambulatory Visit: Payer: Self-pay

## 2020-10-27 ENCOUNTER — Encounter (HOSPITAL_COMMUNITY)
Admission: RE | Disposition: A | Discharge status: Home / Self Care | Payer: Self-pay | Source: Home / Self Care | Attending: Internal Medicine

## 2020-10-27 DIAGNOSIS — I456 Pre-excitation syndrome: Secondary | ICD-10-CM

## 2020-10-27 DIAGNOSIS — I10 Essential (primary) hypertension: Secondary | ICD-10-CM | POA: Diagnosis not present

## 2020-10-27 DIAGNOSIS — E785 Hyperlipidemia, unspecified: Secondary | ICD-10-CM | POA: Diagnosis not present

## 2020-10-27 DIAGNOSIS — I471 Supraventricular tachycardia: Secondary | ICD-10-CM | POA: Diagnosis not present

## 2020-10-27 DIAGNOSIS — Z91048 Other nonmedicinal substance allergy status: Secondary | ICD-10-CM | POA: Insufficient documentation

## 2020-10-27 DIAGNOSIS — Z79899 Other long term (current) drug therapy: Secondary | ICD-10-CM | POA: Diagnosis not present

## 2020-10-27 DIAGNOSIS — Z880 Allergy status to penicillin: Secondary | ICD-10-CM | POA: Diagnosis not present

## 2020-10-27 HISTORY — PX: SVT ABLATION: EP1225

## 2020-10-27 LAB — PREGNANCY, URINE: Preg Test, Ur: NEGATIVE

## 2020-10-27 SURGERY — SVT ABLATION

## 2020-10-27 MED ORDER — BUPIVACAINE HCL (PF) 0.25 % IJ SOLN
INTRAMUSCULAR | Status: DC | PRN
  Administered 2020-10-27: 60 mL

## 2020-10-27 MED ORDER — MIDAZOLAM HCL 5 MG/5ML IJ SOLN
INTRAMUSCULAR | Status: AC
  Filled 2020-10-27: qty 5

## 2020-10-27 MED ORDER — SODIUM CHLORIDE 0.9 % IV SOLN: 250.0000 mL | INTRAVENOUS | Status: DC | PRN

## 2020-10-27 MED ORDER — ONDANSETRON HCL 4 MG/2ML IJ SOLN: 4.0000 mg | Freq: Four times a day (QID) | INTRAMUSCULAR | Status: DC | PRN

## 2020-10-27 MED ORDER — FENTANYL CITRATE (PF) 100 MCG/2ML IJ SOLN
INTRAMUSCULAR | Status: AC
  Filled 2020-10-27: qty 2

## 2020-10-27 MED ORDER — SODIUM CHLORIDE 0.9 % IV SOLN
INTRAVENOUS | Status: DC | PRN
  Administered 2020-10-27 (×2): 4 ug/min via INTRAVENOUS

## 2020-10-27 MED ORDER — LISINOPRIL-HYDROCHLOROTHIAZIDE 10-12.5 MG PO TABS: 1.0000 | ORAL_TABLET | Freq: Every day | ORAL | Status: DC

## 2020-10-27 MED ORDER — MIDAZOLAM HCL 5 MG/5ML IJ SOLN
INTRAMUSCULAR | Status: DC | PRN
  Administered 2020-10-27: 2 mg via INTRAVENOUS
  Administered 2020-10-27 (×10): 1 mg via INTRAVENOUS

## 2020-10-27 MED ORDER — ACETAMINOPHEN 325 MG PO TABS
650.0000 mg | ORAL_TABLET | ORAL | Status: DC | PRN
Start: 2020-10-27 — End: 2020-10-28
  Administered 2020-10-27: 650 mg via ORAL
  Filled 2020-10-27: qty 2

## 2020-10-27 MED ORDER — BUPIVACAINE HCL (PF) 0.25 % IJ SOLN
INTRAMUSCULAR | Status: AC
  Filled 2020-10-27: qty 60

## 2020-10-27 MED ORDER — HEPARIN (PORCINE) IN NACL 1000-0.9 UT/500ML-% IV SOLN
INTRAVENOUS | Status: AC
  Filled 2020-10-27: qty 500

## 2020-10-27 MED ORDER — SODIUM CHLORIDE 0.9 % IV SOLN: INTRAVENOUS | Status: DC

## 2020-10-27 MED ORDER — ISOPROTERENOL HCL 0.2 MG/ML IJ SOLN
INTRAMUSCULAR | Status: AC
  Filled 2020-10-27: qty 5

## 2020-10-27 MED ORDER — ADENOSINE 6 MG/2ML IV SOLN
INTRAVENOUS | Status: AC
  Filled 2020-10-27: qty 2

## 2020-10-27 MED ORDER — HEPARIN (PORCINE) IN NACL 1000-0.9 UT/500ML-% IV SOLN
INTRAVENOUS | Status: DC | PRN
  Administered 2020-10-27 (×2): 500 mL

## 2020-10-27 MED ORDER — FENTANYL CITRATE (PF) 100 MCG/2ML IJ SOLN
INTRAMUSCULAR | Status: DC | PRN
  Administered 2020-10-27 (×4): 12.5 ug via INTRAVENOUS
  Administered 2020-10-27: 25 ug via INTRAVENOUS
  Administered 2020-10-27 (×3): 12.5 ug via INTRAVENOUS
  Administered 2020-10-27: 25 ug via INTRAVENOUS
  Administered 2020-10-27: 12.5 ug via INTRAVENOUS

## 2020-10-27 MED ORDER — LISINOPRIL 10 MG PO TABS
10.0000 mg | ORAL_TABLET | Freq: Every day | ORAL | Status: DC
  Administered 2020-10-27 – 2020-10-28 (×2): 10 mg via ORAL
  Filled 2020-10-27 (×2): qty 1

## 2020-10-27 MED ORDER — SODIUM CHLORIDE 0.9% FLUSH: 3.0000 mL | Freq: Two times a day (BID) | INTRAVENOUS | Status: DC

## 2020-10-27 MED ORDER — METOPROLOL SUCCINATE ER 50 MG PO TB24
50.0000 mg | ORAL_TABLET | Freq: Every day | ORAL | Status: DC
  Administered 2020-10-27 – 2020-10-28 (×2): 50 mg via ORAL
  Filled 2020-10-27 (×2): qty 1

## 2020-10-27 MED ORDER — SODIUM CHLORIDE 0.9% FLUSH: 3.0000 mL | INTRAVENOUS | Status: DC | PRN

## 2020-10-27 MED ORDER — HYDROCHLOROTHIAZIDE 12.5 MG PO CAPS
12.5000 mg | ORAL_CAPSULE | Freq: Every day | ORAL | Status: DC
  Administered 2020-10-27 – 2020-10-28 (×2): 12.5 mg via ORAL
  Filled 2020-10-27 (×2): qty 1

## 2020-10-27 MED ORDER — ADENOSINE 6 MG/2ML IV SOLN
INTRAVENOUS | Status: DC | PRN
  Administered 2020-10-27: 12 mg via INTRAVENOUS

## 2020-10-27 SURGICAL SUPPLY — 10 items
CATH EZ STEER NAV 4MM D-F CUR (ABLATOR) ×1 IMPLANT
CATH JOSEPH QUAD ALLRED 6F REP (CATHETERS) ×2 IMPLANT
CATH JSN HEX 2-5-2 120 (CATHETERS) ×1 IMPLANT
PACK EP LATEX FREE (CUSTOM PROCEDURE TRAY) ×2
PACK EP LF (CUSTOM PROCEDURE TRAY) ×1 IMPLANT
PAD PRO RADIOLUCENT 2001M-C (PAD) ×2 IMPLANT
PATCH CARTO3 (PAD) ×1 IMPLANT
SHEATH PINNACLE 6F 10CM (SHEATH) ×2 IMPLANT
SHEATH PINNACLE 7F 10CM (SHEATH) ×1 IMPLANT
SHEATH PINNACLE 8F 10CM (SHEATH) ×1 IMPLANT

## 2020-10-27 NOTE — Progress Notes (Signed)
Site area: rt IJ venous sheath Site Prior to Removal:  Level 0 Pressure Applied For: 10 minutes Manual:   yes Patient Status During Pull:  stable Post Pull Site:  Level 0 Post Pull Instructions Given:  yes Post Pull Pulses Present: NA Dressing Applied:  petroleum gauze, gauze and tegaderm Bedrest begins @  Comments:   

## 2020-10-27 NOTE — H&P (Signed)
HPI Nancy Davis is referred today by [name], PA-C for evaluation of SVT. 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 She has a h/o syncope but none in a couple of years. She has been on toprol. She also has HTN. She also has dyslipidemia. She states that her heart racing has been present about 4 years. When she is in tachycardia she denies chest pain or sob. She notes that exertion will bring her spells on.       Allergies  Allergen Reactions   Penicillins Anaphylaxis   Coconut Oil        hives              Current Outpatient Medications  Medication Sig Dispense Refill   acetaminophen (TYLENOL) 500 MG tablet Take 500 mg by mouth every 6 (six) hours as needed.       amLODipine (NORVASC) 5 MG tablet Take 1 tablet (5 mg total) by mouth daily. 180 tablet 3   diclofenac Sodium (VOLTAREN) 1 % GEL Apply 2 g topically 4 (four) times daily as needed (knee pain). 50 g 0   lisinopril-hydrochlorothiazide (ZESTORETIC) 10-12.5 MG tablet Take 1 tablet by mouth daily. 30 tablet 1   metoprolol succinate (TOPROL-XL) 50 MG 24 hr tablet Take 1 tablet (50 mg total) by mouth daily. Take with or immediately following a meal. 30 tablet 6   OVER THE COUNTER MEDICATION Vitamin b 12 sl daily       rosuvastatin (CRESTOR) 5 MG tablet Take 1 tablet (5 mg total) by mouth daily. 30 tablet 6    No current facility-administered medications for this visit.            Past Medical History:  Diagnosis Date   Anemia      yrs ago   Dislocation of left knee with medial meniscus tear     Hypertension     Knee pain, left      from old injury playing sports in high school,       ROS:    All systems reviewed and negative except as noted in the HPI.          Past Surgical History:  Procedure Laterality Date   CESAREAN SECTION   2007   KNEE ARTHROSCOPY WITH MEDIAL MENISECTOMY Right 01/01/2020    Procedure: KNEE ARTHROSCOPY WITH MEDIAL MENISECTOMY and  chrondroplasty;  Surgeon: Renette Butters, MD;  Location: [facility];  Service: Orthopedics;  Laterality: Right;        History reviewed. No pertinent family history.     Social History         Socioeconomic History   Marital status: Single      Spouse name: Not on file   Number of children: Not on file   Years of education: Not on file   Highest education level: Not on file  Occupational History   Not on file  Tobacco Use   Smoking status: Never   Smokeless tobacco: Never  Vaping Use   Vaping Use: Never used  Substance and Sexual Activity   Alcohol use: No   Drug use: No   Sexual activity: Not on file  Other Topics Concern   Not on file  Social History Narrative   Not on file    Social Determinants of Health    Financial Resource Strain: Not on file  Food Insecurity: Not on file  Transportation Needs: Not on file  Physical Activity: Not on file  Stress: Not on file  Social Connections: Not on file  Intimate Partner Violence: Not on file        BP 130/80   Pulse (!) 58   Ht '5\' 9"'$  (1.753 m)   Wt 196 lb 12.8 oz (89.3 kg)   SpO2 99%   BMI 29.06 kg/m    Physical Exam:   Well appearing NAD HEENT: Unremarkable Neck:  No JVD, no thyromegally Lymphatics:  No adenopathy Back:  No CVA tenderness Lungs:  Clear with no wheezes HEART:  Regular rate rhythm, no murmurs, no rubs, no clicks Abd:  soft, positive bowel sounds, no organomegally, no rebound, no guarding Ext:  2 plus pulses, no edema, no cyanosis, no clubbing Skin:  No rashes no nodules Neuro:  CN II through XII intact, motor grossly intact   EKG - reviewed. NSR with pre-excitation     Assess/Plan:  WPW - Reviewed of her ECG suggests a right free wall AP. I have discussed the treatment options with her symptoms, documented pre-excitation and documented SVT, I have recommended proceeding with catheter ablation. HTN - once she has undergone ablation, we will consider changing her bp  meds. Dyslipidemia - she will continue crestor.   Carleene Overlie Berdella Bacot,MD

## 2020-10-27 NOTE — Discharge Instructions (Signed)
Post procedure care instructions No driving for 4 days. No lifting over 5 lbs for 1 week. No vigorous or sexual activity for 1 week. You may return to work/your usual activities on 11/04/20. Keep procedure site clean & dry. If you notice increased pain, swelling, bleeding or pus, call/return!  You may shower after 24 hours, but no soaking in baths/hot tubs/pools for 1 week.

## 2020-10-27 NOTE — Progress Notes (Signed)
Site area: rt groin fv sheaths x3 Site Prior to Removal:  Level 0 Pressure Applied For: 15 minutes Manual:   yes Patient Status During Pull:  stable Post Pull Site:  Level 0 Post Pull Instructions Given:  yes Post Pull Pulses Present: rt dp palpable Dressing Applied:  gauze and tegaderm Bedrest begins @ 1610 Comments:

## 2020-10-27 NOTE — Discharge Summary (Addendum)
ELECTROPHYSIOLOGY PROCEDURE DISCHARGE SUMMARY    Patient ID: Nancy Davis,  MRN: [id number], DOB/AGE: 1976/01/21 45 y.o.  Admit date: 10/27/2020 Discharge date: 10/28/20  Primary Care Physician: Default, Provider, MD  Primary Cardiologist: Dr. Debara Pickett Electrophysiologist: Dr. Lovena Le  Primary Discharge Diagnosis:  SVT/WPW syndrome  Secondary Discharge Diagnosis:  HTN  Allergies  Allergen Reactions   Penicillins Anaphylaxis   Coconut Oil Hives     Procedures This Admission: 1.  Electrophysiology study and radiofrequency catheter ablation on 10/27/20 by Dr Lovena Le.   Conclusion: Successful EP study and catheter ablation of a right mid septal accessory pathway in a patient with a prior history of medically refractory SVT.  The patient's accessory pathway was approximately 14 mm from the His bundle region.  The successful ablation did not result in heart block and there was no evidence of any recurrent accessory pathway conduction or inducible SVT following catheter ablation  Brief HPI: Nancy Davis is a 45 y.o. female with a past medical history as outlined above.  She has had increasing tachypalpitations with documented SVT and referred to EP in the outpatient setting.  Risks, benefits, and alternatives to ablation were reviewed with the patient who wished to proceed.   Hospital Course:  The patient was admitted and underwent EPS/RFCA with details as outlined above. She was monitored on telemetry overnight which demonstrated SR.  Groin and R IJ sites are without complication.  The patient was examined by Dr. Lovena Le and considered stable for discharge to home.  Follow up will be arranged in 4 weeks.  Wound care and restrictions were reviewed with the patient prior to discharge.  She feels well, no CP, SOB or site discomfort  Physical Exam: Vitals:   10/27/20 2018 10/28/20 0000 10/28/20 0523 10/28/20 0825  BP: 126/84 122/72  (!) 141/89  Pulse: 72 71  65  Resp: 17 14    Temp:   98 F (36.7 C) 98 F (36.7 C)   TempSrc:   Oral   SpO2: 100% 98% 99%   Weight:      Height:        GEN- The patient is well appearing, alert and oriented x 3 today.   HEENT: normocephalic, atraumatic; sclera clear, conjunctiva pink; hearing intact; oropharynx clear; neck supple, no JVP Lymph- no cervical lymphadenopathy Lungs- CTA b/l, normal work of breathing.  No wheezes, rales, rhonchi Heart-  RRR, no murmurs, rubs or gallops, PMI not laterally displaced GI- soft, non-tender, non-distended Extremities- no clubbing, cyanosis, or edema; R IJ and R groin sites are without hematoma/bruit MS- no significant deformity or atrophy Skin- warm and dry, no rash or lesion Psych- euthymic mood, full affect Neuro- strength and sensation are intact   Discharge Vitals: Blood pressure (!) 141/89, pulse 65, temperature 98 F (36.7 C), temperature source Oral, resp. rate 14, height '5\' 9"'$  (1.753 m), weight 88 kg, SpO2 99 %.    Labs:   Lab Results  Component Value Date   WBC 6.3 10/09/2020   HGB 10.9 (L) 10/09/2020   HCT 33.9 (L) 10/09/2020   MCV 63 (L) 10/09/2020   PLT 269 10/09/2020   No results for input(s): NA, K, CL, CO2, BUN, CREATININE, CALCIUM, PROT, BILITOT, ALKPHOS, ALT, AST, GLUCOSE in the last 168 hours.  Invalid input(s): LABALBU  Discharge Medications:  Allergies as of 10/28/2020       Reactions   Penicillins Anaphylaxis   Coconut Oil Hives        Medication List  TAKE these medications    acetaminophen 500 MG tablet Commonly known as: TYLENOL Take 500 mg by mouth every 6 (six) hours as needed for mild pain or moderate pain.   amLODipine 10 MG tablet Commonly known as: NORVASC Take 1 tablet (10 mg total) by mouth daily.   diclofenac Sodium 1 % Gel Commonly known as: Voltaren Apply 2 g topically 4 (four) times daily as needed (knee pain).   lisinopril-hydrochlorothiazide 10-12.5 MG tablet Commonly known as: Zestoretic Take 1 tablet by mouth daily.    metoprolol succinate 50 MG 24 hr tablet Commonly known as: TOPROL-XL Take 1 tablet (50 mg total) by mouth daily. Take with or immediately following a meal.   multivitamin with minerals tablet Take 1 tablet by mouth daily. Woman   naproxen 500 MG tablet Commonly known as: NAPROSYN Take 1 tablet (500 mg total) by mouth 2 (two) times daily.   OVER THE COUNTER MEDICATION Take by mouth daily. Vitamin b 12 sl liquid   rosuvastatin 5 MG tablet Commonly known as: CRESTOR Take 1 tablet (5 mg total) by mouth daily.   Visine 0.025-0.3 % ophthalmic solution Generic drug: naphazoline-pheniramine Place 1 drop into both eyes 4 (four) times daily as needed for eye irritation.        Disposition: Home  Discharge Instructions     Diet - low sodium heart healthy   Complete by: As directed    Increase activity slowly   Complete by: As directed        Follow-up Information     Evans Lance, MD Follow up.   Specialty: Cardiology Why: 11/25/20 @ 8:30AM Contact information: [address] [address] [phone]                 Duration of Discharge Encounter: Greater than 30 minutes including physician time.  Venetia Night, PA-C 10/28/2020 10:07 AM  EP Attending  Patient seen and examined. Agree with the findings as noted above. The patient is doing well after EP study and catheter ablation. She has no hematoma and there is no ventricular pre-excitation on her 12 lead ECG. She will be discharged home with usual followup.    Carleene Overlie Josue Kass,MD

## 2020-10-28 ENCOUNTER — Encounter (HOSPITAL_COMMUNITY): Payer: Self-pay | Admitting: Internal Medicine

## 2020-10-28 DIAGNOSIS — I471 Supraventricular tachycardia: Secondary | ICD-10-CM | POA: Diagnosis not present

## 2020-10-28 DIAGNOSIS — E785 Hyperlipidemia, unspecified: Secondary | ICD-10-CM | POA: Diagnosis not present

## 2020-10-28 DIAGNOSIS — I456 Pre-excitation syndrome: Secondary | ICD-10-CM | POA: Diagnosis not present

## 2020-10-28 DIAGNOSIS — I10 Essential (primary) hypertension: Secondary | ICD-10-CM | POA: Diagnosis not present

## 2020-11-25 ENCOUNTER — Ambulatory Visit: Payer: Medicaid Other | Admitting: Internal Medicine

## 2020-12-16 ENCOUNTER — Emergency Department (HOSPITAL_COMMUNITY)
Admission: EM | Admit: 2020-12-16 | Discharge: 2020-12-16 | Disposition: A | Discharge status: Home / Self Care | Payer: Medicaid Other | Attending: Emergency Medicine | Admitting: Emergency Medicine

## 2020-12-16 ENCOUNTER — Encounter (HOSPITAL_COMMUNITY): Payer: Self-pay

## 2020-12-16 ENCOUNTER — Emergency Department (HOSPITAL_COMMUNITY): Payer: Medicaid Other

## 2020-12-16 DIAGNOSIS — Y9301 Activity, walking, marching and hiking: Secondary | ICD-10-CM | POA: Insufficient documentation

## 2020-12-16 DIAGNOSIS — W010XXA Fall on same level from slipping, tripping and stumbling without subsequent striking against object, initial encounter: Secondary | ICD-10-CM | POA: Insufficient documentation

## 2020-12-16 DIAGNOSIS — I10 Essential (primary) hypertension: Secondary | ICD-10-CM | POA: Insufficient documentation

## 2020-12-16 DIAGNOSIS — M25561 Pain in right knee: Secondary | ICD-10-CM

## 2020-12-16 DIAGNOSIS — Z79899 Other long term (current) drug therapy: Secondary | ICD-10-CM | POA: Diagnosis not present

## 2020-12-16 MED ORDER — NAPROXEN 500 MG PO TABS: 500.0000 mg | ORAL_TABLET | Freq: Two times a day (BID) | ORAL | 0 refills | Status: DC

## 2020-12-16 MED ORDER — KETOROLAC TROMETHAMINE 30 MG/ML IJ SOLN
30.0000 mg | Freq: Once | INTRAMUSCULAR | Status: AC
  Administered 2020-12-16: 30 mg via INTRAMUSCULAR
  Filled 2020-12-16: qty 1

## 2020-12-16 NOTE — ED Triage Notes (Signed)
Pt reports tripping over a tree stump yesterday and c/o swelling to the right knee.   10/10 pain. Took ibuprofen at 6am this morning with no relief.  A/Ox4 Ambulatory

## 2020-12-16 NOTE — ED Provider Notes (Signed)
[facility] DEPT Provider Note   CSN: [id number] Arrival date & time: 12/16/20  1041     History No chief complaint on file.   Nancy Davis is a 45 y.o. female.  HPI  Patient presents with right knee pain.  The started acutely when she was walking and tripped over a tree stump yesterday.  The pain is been constant since then, worse with ambulation.  She has a history of arthroscopy on this knee with medial meniscal tear.  Does not feel quite as terrible as that time but the pain is similar.  She is able to ambulate with some pain.  Past Medical History:  Diagnosis Date   Anemia    yrs ago   Dislocation of left knee with medial meniscus tear    Hypertension    Knee pain, left    from old injury playing sports in high school,     Patient Active Problem List   Diagnosis Date Noted   Wolff-Parkinson-White (WPW) syndrome 10/27/2020   WPW (Wolff-Parkinson-White syndrome) 10/23/2020   SVT (supraventricular tachycardia) ([facility]) 08/29/2020   Elevated heart rate with elevated blood pressure without diagnosis of hypertension 07/14/2020   Microcytosis 07/14/2020   Hypertension    Elevated troponin I level     Past Surgical History:  Procedure Laterality Date   CESAREAN SECTION  2007   KNEE ARTHROSCOPY WITH MEDIAL MENISECTOMY Right 01/01/2020   Procedure: KNEE ARTHROSCOPY WITH MEDIAL MENISECTOMY and chrondroplasty;  Surgeon: Renette Butters, MD;  Location: [facility];  Service: Orthopedics;  Laterality: Right;   SVT ABLATION N/A 10/27/2020   Procedure: SVT ABLATION;  Surgeon: Evans Lance, MD;  Location: [facility];  Service: Cardiovascular;  Laterality: N/A;     OB History   No obstetric history on file.     No family history on file.  Social History   Tobacco Use   Smoking status: Never   Smokeless tobacco: Never  Vaping Use   Vaping Use: Never used  Substance Use Topics   Alcohol use: No   Drug use: No     Home Medications Prior to Admission medications   Medication Sig Start Date End Date Taking? Authorizing Provider  acetaminophen (TYLENOL) 500 MG tablet Take 500 mg by mouth every 6 (six) hours as needed for mild pain or moderate pain.    [provider]  amLODipine (NORVASC) 10 MG tablet Take 1 tablet (10 mg total) by mouth daily. 10/23/20   Duke, Tami Lin, PA  diclofenac Sodium (VOLTAREN) 1 % GEL Apply 2 g topically 4 (four) times daily as needed (knee pain). 06/20/20   Long, Wonda Olds, MD  lisinopril-hydrochlorothiazide (ZESTORETIC) 10-12.5 MG tablet Take 1 tablet by mouth daily. 07/14/20 10/24/20  Cherene Altes, MD  metoprolol succinate (TOPROL-XL) 50 MG 24 hr tablet Take 1 tablet (50 mg total) by mouth daily. Take with or immediately following a meal. 08/12/20 11/10/20  Duke, Tami Lin, PA  Multiple Vitamins-Minerals (MULTIVITAMIN WITH MINERALS) tablet Take 1 tablet by mouth daily. Woman    [provider]  naphazoline-pheniramine (VISINE) 0.025-0.3 % ophthalmic solution Place 1 drop into both eyes 4 (four) times daily as needed for eye irritation.    [provider]  naproxen (NAPROSYN) 500 MG tablet Take 1 tablet (500 mg total) by mouth 2 (two) times daily. 09/26/20   Sherrill Raring, PA-C  OVER THE COUNTER MEDICATION Take by mouth daily. Vitamin b 12 sl liquid  [provider]  rosuvastatin (CRESTOR) 5 MG tablet Take 1 tablet (5 mg total) by mouth daily. 08/12/20 11/10/20  Ledora Bottcher, PA    Allergies    Penicillins and Coconut oil  Review of Systems   Review of Systems  Constitutional:  Negative for fever.  Musculoskeletal:  Positive for arthralgias, gait problem and myalgias.   Physical Exam Updated Vital Signs BP (!) 147/96 (BP Location: Left Arm)   Pulse 85   Temp 98.2 F (36.8 C) (Oral)   Resp 16   SpO2 95%   Physical Exam Vitals and nursing note reviewed. Exam conducted with a chaperone present.  Constitutional:       General: She is not in acute distress.    Appearance: Normal appearance.  HENT:     Head: Normocephalic and atraumatic.  Eyes:     General: No scleral icterus.    Extraocular Movements: Extraocular movements intact.     Pupils: Pupils are equal, round, and reactive to light.  Cardiovascular:     Comments: DP and PT 2+. Musculoskeletal:        General: Tenderness present. No swelling or deformity. Normal range of motion.     Comments: Patient can flex and extend and bear weight on the right knee.  No significant laxity.  Tender to palpation.  Skin:    Coloration: Skin is not jaundiced.  Neurological:     Mental Status: She is alert. Mental status is at baseline.     Coordination: Coordination normal.    ED Results / Procedures / Treatments   Labs (all labs ordered are listed, but only abnormal results are displayed) Labs Reviewed - No data to display  EKG None  Radiology No results found.  Procedures Procedures   Medications Ordered in ED Medications - No data to display  ED Course  I have reviewed the triage vital signs and the nursing notes.  Pertinent labs & imaging results that were available during my care of the patient were reviewed by me and considered in my medical decision making (see chart for details).    MDM Rules/Calculators/A&P                           Patient vitals are stable, no fever.  Doubt septic joint, not acutely tender to touch.  She is able to flex and extend at the knee joint, good range of motion.  She does have a slight amount of swelling surrounding the knee, but not a significant effusion.  No overlying skin changes like a cellulitis.  Will order radiograph to assess for possible dislocation subluxation or fracture.  Based on the mechanism lower suspicion, she is amatory without difficulty.  Radiograph negative, suspect muscle sprain.  Patient does have history of recent meniscal injury, will have her follow-up with orthopedic surgeon.   Patient desires a second opinion as we will give her alternative orthopedic group from her main group.  Return precautions discussed, we will have patient use the sleeve as needed which she already has at home.  We will give a short dose of anti-inflammatories.  Final Clinical Impression(s) / ED Diagnoses Final diagnoses:  None    Rx / DC Orders ED Discharge Orders     None        Sherrill Raring, Hershal Coria 12/16/20 1206    Hayden Rasmussen, MD 12/16/20 [phone]

## 2020-12-16 NOTE — Discharge Instructions (Addendum)
Take naproxen twice daily with food and water for the next week. Use your knee sleve as needed for comfort. Follow-up with orthopedic physician for additional work-up. If things change or worsen return back to the ED as needed.

## 2021-01-24 NOTE — Progress Notes (Deleted)
Cardiology Office Note:    Date:  01/24/2021   ID:  Nancy Davis, DOB 1975/10/21, MRN [id number]  PCP:  Default, Provider, MD  Cardiologist:  Pixie Casino, MD   Referring MD: No ref. provider found   No chief complaint on file. ***  History of Present Illness:    Nancy Davis is a 45 y.o. female with a hx of hypertension and tachycardia. She was initially diagnosed with both in [place], but stopped taking medications over 20 years ago. Hx of lisinopril and HCTZ along with ?BB. She presented to the ER 07/14/20 with tachycardia and chest pain. 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 She had chest discomfort with this, which resolved with resolution of her tachycardia. Echo in the ER with normal EF, but evidence of hypertensive heart disease with elevated LVEDP and grade 2 DD, mild to moderate TR. CTA negative for PE and did not show aortic atherosclerosis or coronary calcifications. Heart monitor was placed and she was restarted on lisinopril-HCTZ. Toprol was also added.   Heart monitor showed short runs of NSVT (< 5 beats) and PSVT. I saw her in follow up 08/13/20. She continued to have palpitations with EKG consistent with WPW and I referred her to EP.  Chest pain felt atypical. CTA in hospital did not note coronary atherosclerosis. She has a strong family history of heart disease - sister had PCI at age 39. Given this, I ordered a nuclear stress test that was negative for ischemia. She underwent WPW ablation with Dr. Lovena Le 10/28/20 with a good result.   She returns today for follow up.     Palpitations WPW, NSVT, PSVT - taking 50 mg toprol - successful ablation with Dr. Lovena Le 10/27/20   Hypertension - lisinopril-HCTZ, amlodipine (added last visit), and toprol - BP today is    Grade 2 DD - important to control BP - no signs of heart failure   Prediabetes  - A1c 6.0% - will need  to monitor   Hyperlipidemia with LDL goal < 70 07/14/2020: Cholesterol 191; HDL 48; LDL Cholesterol 128; Triglycerides 75; VLDL 15 Given family history of premature heart disease, I started 5 mg crestor at last visit Consider checking lipids****    Past Medical History:  Diagnosis Date   Anemia    yrs ago   Dislocation of left knee with medial meniscus tear    Hypertension    Knee pain, left    from old injury playing sports in high school,     Past Surgical History:  Procedure Laterality Date   CESAREAN SECTION  2007   KNEE ARTHROSCOPY WITH MEDIAL MENISECTOMY Right 01/01/2020   Procedure: KNEE ARTHROSCOPY WITH MEDIAL MENISECTOMY and chrondroplasty;  Surgeon: Renette Butters, MD;  Location: [facility];  Service: Orthopedics;  Laterality: Right;   SVT ABLATION N/A 10/27/2020   Procedure: SVT ABLATION;  Surgeon: Evans Lance, MD;  Location: [facility];  Service: Cardiovascular;  Laterality: N/A;    Current Medications: No outpatient medications have been marked as taking for the 02/05/21 encounter (Appointment) with Ledora Bottcher, [place].     Allergies:   Penicillins and Coconut oil   Social History   Socioeconomic History   Marital status: Single    Spouse name: Not on file   Number of children: Not on file   Years of education: Not on file   Highest  education level: Not on file  Occupational History   Not on file  Tobacco Use   Smoking status: Never   Smokeless tobacco: Never  Vaping Use   Vaping Use: Never used  Substance and Sexual Activity   Alcohol use: No   Drug use: No   Sexual activity: Not on file  Other Topics Concern   Not on file  Social History Narrative   Not on file   Social Determinants of Health   Financial Resource Strain: Not on file  Food Insecurity: Not on file  Transportation Needs: Not on file  Physical Activity: Not on file  Stress: Not on file  Social Connections: Not on file     Family  History: The patient's ***family history is not on file.  ROS:   Please see the history of present illness.    *** All other systems reviewed and are negative.  EKGs/Labs/Other Studies Reviewed:    The following studies were reviewed today: ***  EKG:  EKG is *** ordered today.  The ekg ordered today demonstrates ***  Recent Labs: 07/13/2020: ALT 17 07/14/2020: Magnesium 1.8; TSH 2.380 10/09/2020: BUN 20; Creatinine, Ser 0.70; Hemoglobin 10.9; Platelets 269; Potassium 4.0; Sodium 138  Recent Lipid Panel    Component Value Date/Time   CHOL 191 07/14/2020 0942   TRIG 75 07/14/2020 0942   HDL 48 07/14/2020 0942   CHOLHDL 4.0 07/14/2020 0942   VLDL 15 07/14/2020 0942   LDLCALC 128 (H) 07/14/2020 0942    Physical Exam:    VS:  There were no vitals taken for this visit.    Wt Readings from Last 3 Encounters:  10/27/20 194 lb (88 kg)  10/23/20 194 lb 3.2 oz (88.1 kg)  08/29/20 196 lb 12.8 oz (89.3 kg)     GEN: *** Well nourished, well developed in no acute distress HEENT: Normal NECK: No JVD; No carotid bruits LYMPHATICS: No lymphadenopathy CARDIAC: ***RRR, no murmurs, rubs, gallops RESPIRATORY:  Clear to auscultation without rales, wheezing or rhonchi  ABDOMEN: Soft, non-tender, non-distended MUSCULOSKELETAL:  No edema; No deformity  SKIN: Warm and dry NEUROLOGIC:  Alert and oriented x 3 PSYCHIATRIC:  Normal affect   ASSESSMENT:    No diagnosis found. PLAN:    In order of problems listed above:  No diagnosis found.   Medication Adjustments/Labs and Tests Ordered: Current medicines are reviewed at length with the patient today.  Concerns regarding medicines are outlined above.  No orders of the defined types were placed in this encounter.  No orders of the defined types were placed in this encounter.   Signed, Ledora Bottcher, PA  01/24/2021 10:04 AM    [facility]

## 2021-02-05 ENCOUNTER — Ambulatory Visit: Payer: Medicaid Other | Admitting: Physician Assistant

## 2021-02-06 ENCOUNTER — Other Ambulatory Visit: Payer: Self-pay

## 2021-02-06 ENCOUNTER — Ambulatory Visit: Payer: Medicaid Other | Attending: Orthopedic Surgery | Admitting: Physical Therapy

## 2021-02-06 ENCOUNTER — Encounter: Payer: Self-pay | Admitting: Physical Therapy

## 2021-02-06 DIAGNOSIS — R2689 Other abnormalities of gait and mobility: Secondary | ICD-10-CM | POA: Insufficient documentation

## 2021-02-06 DIAGNOSIS — G8929 Other chronic pain: Secondary | ICD-10-CM | POA: Insufficient documentation

## 2021-02-06 DIAGNOSIS — M6281 Muscle weakness (generalized): Secondary | ICD-10-CM | POA: Insufficient documentation

## 2021-02-06 DIAGNOSIS — M25561 Pain in right knee: Secondary | ICD-10-CM | POA: Diagnosis present

## 2021-02-06 NOTE — Patient Instructions (Signed)
Access Code: [facility] URL: [email or web address] Date: 02/06/2021 Prepared by: Shearon Balo  Exercises Supine Quad Set - 3 x daily - 7 x weekly - 2 sets - 10 reps - 10 hold

## 2021-02-06 NOTE — Therapy (Signed)
[place], [place], [address] Phone: [phone]   Fax:  [phone]  Physical Therapy Evaluation  Patient Details  Name: Nancy Davis MRN: [id number] Date of Birth: 1975/07/18 Referring Provider (PT): Georgeanna Harrison, MD  Encounter Date: 02/06/2021   PT End of Session - 02/06/21 0954     Visit Number 1    Number of Visits 16    Date for PT Re-Evaluation 04/03/21    Authorization Type [employer]    PT Start Time 0914    PT Stop Time 0950    PT Time Calculation (min) 36 min             Past Medical History:  Diagnosis Date   Anemia    yrs ago   Dislocation of left knee with medial meniscus tear    Hypertension    Knee pain, left    from old injury playing sports in high school,     Past Surgical History:  Procedure Laterality Date   CESAREAN SECTION  2007   KNEE ARTHROSCOPY WITH MEDIAL MENISECTOMY Right 01/01/2020   Procedure: KNEE ARTHROSCOPY WITH MEDIAL MENISECTOMY and chrondroplasty;  Surgeon: Renette Butters, MD;  Location: [facility];  Service: Orthopedics;  Laterality: Right;   SVT ABLATION N/A 10/27/2020   Procedure: SVT ABLATION;  Surgeon: Evans Lance, MD;  Location: [facility];  Service: Cardiovascular;  Laterality: N/A;    There were no vitals filed for this visit.  Subjective:   Nancy Davis is a 45 y.o. female who presents to clinic with chief complaint of R knee pain.    MOI/History of condition: History of R menisectomy about 1 year ago with continued knee pain following.  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, art, walking on concrete floors Hobbies: walking for exercise Functional limitations/goals (if different from vocation and hobbies): pain at work and with walking for recreation Home environment: lives with daughter at home, 2 story house with railing Assistive device: none    [employer] PT Assessment - 02/06/21 0001       Assessment   Medical Diagnosis R knee pain    Referring Provider (PT) Georgeanna Harrison, MD    Onset Date/Surgical Date 02/07/20      Precautions   Precaution Comments Significant PMH: past history of SVT      Restrictions   Other Position/Activity Restrictions none      Balance Screen   Has the patient fallen in the past 6 months Yes    How many times? 1   slipped on step   Has the patient had a decrease in activity level because of a fear of falling?  Yes    Is the patient reluctant to leave their home because of a fear of falling?  Yes      Prior Function   Level of Independence Independent      Observation/Other Assessments   Observations mod edema r knee, antalgic gait lacking full ext and flexion    Focus on Therapeutic Outcomes (FOTO)  none    Other Surveys  --  Nancy Davis 21/100     Functional Tests   Functional tests Other;Sit to Stand      Other:   Other/ Comments 30'' step up test (8'' step): L 5x, R 2x (unable to step down)      ROM / Strength   AROM / PROM / Strength PROM;Strength      PROM   Overall PROM Comments Closed chain ankle DF: L equivocal, R fail  R knee WNL,    PROM Assessment Site Knee    Right/Left Knee Right;Left    Right Knee Extension 6   w/ pain   Right Knee Flexion 100   w/ pain   Left Knee Extension --    Left Knee Flexion --      Strength   Overall Strength Comments step up  test reveals gross weakness LE bil    Strength Assessment Site Knee    Right/Left Knee Right;Left    Right Knee Flexion 3+/5   w/pain   Right Knee Extension 3/5    with pain   Left Knee Flexion 4/5    Left Knee Extension 4/5      Palpation   Palpation comment TTP peripatellar and anterior joing line                        Objective measurements completed on examination: See above findings.                PT Education - 02/06/21 0938     Education Details POC, diagnosis, prognosis, HEP.  Pt educated via explanation, demonstration, and handout (HEP).  Pt confirms understanding verbally.              PT Short Term Goals - 02/06/21 0956       PT SHORT TERM GOAL #1   Title Celene will be >75% HEP compliant to improve carryover between sessions and facilitate independent management of condition    Target Date 02/27/21               PT Long Term Goals - 02/06/21 0956       PT LONG TERM GOAL #1   Title Target date for all long term goals:  04/03/2021      PT LONG TERM GOAL #2   Title Marialena will improve right ankle DF to equivocal category on FMS ankle DF screen (pt stands in tandem stance and performs closed chain DF of rear foot to maximal DF without lifting heel, categories are measured based on vertical line from forward foot's medial malleolus  if patella does not reach line = fail, if patella falls within line = equivocal, if patella exceeds line = pass) to normalize terminal stance phase of gait and reduce stress on posterior ankle structures  EVAL: fail      PT LONG TERM GOAL #3   Title Teondra will improve KOOS Jr score from 21 (on evaluation) to 50 as a proxy for functional improvement      PT LONG TERM GOAL #4   Title Marga will improve reps of 30'' step up to 8'' box to >/= 6x (right LE(s)) to show improved LE strength and ability to navigate steps  EVAL: L 5x, R 2x (unable to step down)      PT LONG TERM GOAL #5   Title Roseann will improve the following MMTs to >/= 4/5 to show improvement in strength:  knee  EVAL: R knee: ext 3/5 w/ pain, flexion  3+/5                    Plan -  02/06/21 0954     Clinical Impression Statement Nancy Davis is a 45 y.o. female who presents to clinic with signs and sxs consistent with R knee pain secondary to degenerative changes.  She does report some clicking and popping but d/t about of pain special tests for meniscus are unrelieable; unable to rule out meniscal involvement.  Pt presents with pain and impairments/deficits in: R knee ROM, bil knee strength, bil LE strength.  Activity limitations include: steps, standing, walking.  Participation limitations include: recreation (walking) and completing work without pain.  Pt will benefit from skilled therapy to address pain and the listed deficits in order to achieve functional goals, enable safety and independence in completion of daily tasks, and return to PLOF.    Stability/Clinical Decision Making Stable/Uncomplicated    Clinical Decision Making Low    Rehab Potential Fair    PT Frequency 2x / week    PT Duration 8 weeks    PT Treatment/Interventions ADLs/Self Care Home Management;Aquatic Therapy;Iontophoresis 4mg /ml Dexamethasone;Gait training;Therapeutic activities;Therapeutic exercise;Neuromuscular re-education;Manual techniques;Dry needling;Vasopneumatic Device    PT Next Visit Plan progressive knee and LE strengthening    PT Home Exercise Plan [facility]    Consulted and Agree with Plan of Care Patient             Patient will benefit from skilled therapeutic intervention in order to improve the following deficits and impairments:  Abnormal gait, Decreased activity tolerance, Decreased strength, Pain, Impaired flexibility, Difficulty walking, Decreased balance  Visit Diagnosis: Chronic pain of right knee  Muscle weakness  Other abnormalities of gait and mobility     Problem List Patient Active Problem List   Diagnosis Date Noted   Wolff-Parkinson-White (WPW) syndrome 10/27/2020   WPW (Wolff-Parkinson-White syndrome) 10/23/2020   SVT (supraventricular tachycardia) ([facility])  08/29/2020   Elevated heart rate with elevated blood pressure without diagnosis of hypertension 07/14/2020   Microcytosis 07/14/2020   Hypertension    Elevated troponin I level     Mathis Dad, PT 02/06/2021, 9:59 AM  [facility] [address] [place], [place], [address] Phone: [phone]   Fax:  [phone]  Name: MARLEIGH KAYLOR MRN: [id number] Date of Birth: [birth year]

## 2021-02-19 ENCOUNTER — Ambulatory Visit: Payer: Medicaid Other

## 2021-02-21 ENCOUNTER — Ambulatory Visit: Payer: Medicaid Other

## 2021-02-23 ENCOUNTER — Ambulatory Visit: Payer: Medicaid Other | Admitting: Internal Medicine

## 2021-02-23 ENCOUNTER — Other Ambulatory Visit: Payer: Self-pay

## 2021-02-23 ENCOUNTER — Encounter: Payer: Self-pay | Admitting: Internal Medicine

## 2021-02-23 VITALS — BP 124/72 | HR 70 | Ht 69.0 in | Wt 183.4 lb

## 2021-02-23 DIAGNOSIS — I456 Pre-excitation syndrome: Secondary | ICD-10-CM | POA: Diagnosis not present

## 2021-02-23 NOTE — Patient Instructions (Signed)
Medication Instructions:  Your physician has recommended you make the following change in your medication: WEAN off of your Metoprolol (toprol) -- Take 1/2 tablet daily for 2 weeks, then  Take 1/2 tablet every other day for 2 weeks, then Stop   *If you need a refill on your cardiac medications before your next appointment, please call your pharmacy*   Lab Work: None ordered   Testing/Procedures: None ordered   Follow-Up: At [facility], you and your health needs are our priority.  As part of our continuing mission to provide you with exceptional heart care, we have created designated Provider Care Teams.  These Care Teams include your primary Cardiologist (physician) and Advanced Practice Providers (APPs -  Physician Assistants and Nurse Practitioners) who all work together to provide you with the care you need, when you need it.  Your next appointment:   as  needed  The format for your next appointment:   In Person  Provider:   Cristopher Peru, MD    Thank you for choosing [facility]!!   [phone]

## 2021-02-23 NOTE — Progress Notes (Signed)
HPI Nancy Davis returns today for follow-up.  She is a very pleasant 45 year old woman with a history of Wolff-Parkinson-White syndrome and SVT, refractory to medical therapy who underwent EP study and catheter ablation several weeks ago.  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  Since her ablation she has done well with no recurrent tachypalpitations.  She has changed her diet and has lost almost 20 pounds.  She feels better with more energy.  No syncope.  No chest pain or shortness of breath.  She would like to discontinue her beta-blocker. Allergies  Allergen Reactions   Penicillins Anaphylaxis   Coconut Oil Hives     Current Outpatient Medications  Medication Sig Dispense Refill   acetaminophen (TYLENOL) 500 MG tablet Take 500 mg by mouth every 6 (six) hours as needed for mild pain or moderate pain.     amLODipine (NORVASC) 10 MG tablet Take 1 tablet (10 mg total) by mouth daily. 90 tablet 3   diclofenac Sodium (VOLTAREN) 1 % GEL Apply 2 g topically 4 (four) times daily as needed (knee pain). 50 g 0   lisinopril-hydrochlorothiazide (ZESTORETIC) 10-12.5 MG tablet Take 1 tablet by mouth daily. 30 tablet 1   meloxicam (MOBIC) 15 MG tablet Take 15 mg by mouth daily.     Multiple Vitamins-Minerals (MULTIVITAMIN WITH MINERALS) tablet Take 1 tablet by mouth daily. Woman     naphazoline-pheniramine (VISINE) 0.025-0.3 % ophthalmic solution Place 1 drop into both eyes 4 (four) times daily as needed for eye irritation.     naproxen (NAPROSYN) 500 MG tablet Take 1 tablet (500 mg total) by mouth 2 (two) times daily. 14 tablet 0   OVER THE COUNTER MEDICATION Take by mouth daily. Vitamin b 12 sl liquid     rosuvastatin (CRESTOR) 5 MG tablet Take 1 tablet (5 mg total) by mouth daily. 30 tablet 6   No current facility-administered medications for this visit.     Past Medical History:  Diagnosis Date   Anemia    yrs  ago   Dislocation of left knee with medial meniscus tear    Hypertension    Knee pain, left    from old injury playing sports in high school,     ROS:   All systems reviewed and negative except as noted in the HPI.   Past Surgical History:  Procedure Laterality Date   CESAREAN SECTION  2007   KNEE ARTHROSCOPY WITH MEDIAL MENISECTOMY Right 01/01/2020   Procedure: KNEE ARTHROSCOPY WITH MEDIAL MENISECTOMY and chrondroplasty;  Surgeon: Renette Butters, MD;  Location: [place];  Service: Orthopedics;  Laterality: Right;   SVT ABLATION N/A 10/27/2020   Procedure: SVT ABLATION;  Surgeon: Evans Lance, MD;  Location: [place] [facility];  Service: Cardiovascular;  Laterality: N/A;     History reviewed. No pertinent family history.   Social History   Socioeconomic History   Marital status: Single    Spouse name: Not on file   Number of children: Not on file   Years of education: Not on file   Highest education level: Not on file  Occupational History   Not on file  Tobacco Use   Smoking status: Never   Smokeless tobacco: Never  Vaping Use   Vaping Use: Never used  Substance and Sexual Activity   Alcohol use: No   Drug use: No   Sexual activity:  Not on file  Other Topics Concern   Not on file  Social History Narrative   Not on file   Social Determinants of Health   Financial Resource Strain: Not on file  Food Insecurity: Not on file  Transportation Needs: Not on file  Physical Activity: Not on file  Stress: Not on file  Social Connections: Not on file  Intimate Partner Violence: Not on file     BP 124/72   Pulse 70   Ht 5\' 9"  (1.753 m)   Wt 183 lb 6.4 oz (83.2 kg)   SpO2 98%   BMI 27.08 kg/m   Physical Exam:  Well appearing NAD HEENT: Unremarkable Neck:  No JVD, no thyromegally Lymphatics:  No adenopathy Back:  No CVA tenderness Lungs:  Clear, with no wheezes, rales, or rhonchi HEART:  Regular rate rhythm, no murmurs, no rubs, no  clicks Abd:  soft, positive bowel sounds, no organomegally, no rebound, no guarding Ext:  2 plus pulses, no edema, no cyanosis, no clubbing Skin:  No rashes no nodules Neuro:  CN II through XII intact, motor grossly intact  EKG -normal sinus rhythm with no ventricular preexcitation  Assess/Plan:  1.  Wolff-Parkinson-White syndrome -she is status post catheter ablation and doing well with no evidence of recurrence of her accessory pathway.  She will undergo watchful waiting.  She will wean off of her beta-blocker. 2.  Hypertension -with weight loss her blood pressure is much improved.  I have given her instructions to wean off the beta-blocker. 3.  Obesity -her weight is down almost 20 pounds.  She is encouraged to maintain her low-fat low-carb diet.  Cristopher Peru, MD

## 2021-02-24 ENCOUNTER — Encounter: Payer: Self-pay | Admitting: Physical Therapy

## 2021-02-24 ENCOUNTER — Encounter: Payer: Self-pay | Admitting: Physician Assistant

## 2021-02-24 ENCOUNTER — Ambulatory Visit: Payer: Medicaid Other | Attending: Orthopedic Surgery | Admitting: Physical Therapy

## 2021-02-24 DIAGNOSIS — R2689 Other abnormalities of gait and mobility: Secondary | ICD-10-CM

## 2021-02-24 DIAGNOSIS — M6281 Muscle weakness (generalized): Secondary | ICD-10-CM

## 2021-02-24 DIAGNOSIS — M25561 Pain in right knee: Secondary | ICD-10-CM | POA: Diagnosis present

## 2021-02-24 DIAGNOSIS — G8929 Other chronic pain: Secondary | ICD-10-CM | POA: Diagnosis present

## 2021-02-24 NOTE — Patient Instructions (Addendum)
Access Code: [facility] URL: [email or web address] Date: 02/24/2021 Prepared by: Shearon Balo  Exercises Supine Quad Set - 3 x daily - 7 x weekly - 2 sets - 10 reps - 10 hold Supine Active Straight Leg Raise - 1 x daily - 7 x weekly - 3 sets - 10 reps Hooklying Clamshell with Resistance - 1 x daily - 7 x weekly - 3 sets - 10 reps

## 2021-02-24 NOTE — Therapy (Signed)
[place], [place], [address] Phone: [phone]   Fax:  [phone]  Physical Therapy Treatment  Patient Details  Name: Nancy Davis MRN: [id number] Date of Birth: 12-13-1975 Referring Provider (PT): Georgeanna Harrison, MD   Encounter Date: 02/24/2021   PT End of Session - 02/24/21 1739     Visit Number 2    Number of Visits 16    Date for PT Re-Evaluation 04/03/21    Authorization Type [employer]    PT Start Time 9528    PT Stop Time 4132    PT Time Calculation (min) 41 min             Past Medical History:  Diagnosis Date   Anemia    yrs ago   Dislocation of left knee with medial meniscus tear    Hypertension    Knee pain, left    from old injury playing sports in high school,     Past Surgical History:  Procedure Laterality Date   CESAREAN SECTION  2007   KNEE ARTHROSCOPY WITH MEDIAL MENISECTOMY Right 01/01/2020   Procedure: KNEE ARTHROSCOPY WITH MEDIAL MENISECTOMY and chrondroplasty;  Surgeon: Renette Butters, MD;  Location: [place];  Service: Orthopedics;  Laterality: Right;   SVT ABLATION N/A 10/27/2020   Procedure: SVT ABLATION;  Surgeon: Evans Lance, MD;  Location: [place] [facility];  Service: Cardiovascular;  Laterality: N/A;    There were no vitals filed for this visit.   Subjective Assessment - 02/24/21 1740     Subjective Pt reports that she has been HEP complaint and that he exercises have helped her knees.  She reports 5/10 R knee pain currently.                               [facility] Adult PT Treatment/Exercise - 02/24/21 0001       Blood Flow Restriction   Blood Flow Restriction Yes      Blood Flow Restriction-Positions    Blood Flow Restriction Position Supine      BFR-Supine   Supine Limb Occulsion Pressure (mmHg) 230    Supine Exercise Pressure (mmHg) 160    Supine Exercise Prescription 30,15,15,15, reps w/ 30-60  sec rest    Supine Exercise Prescription Comment SAQ in supine                   [facility] Adult PT Treatment/Exercise:  Therapeutic Exercise: - nu-step L7 47m while taking subjective and planning session with patient - slant board stretch - 3x45'' - rocker board D/F P/F - 20x - heel raises on step - 3x10 - SLR - 3x10  - bridge on Pball - 3x10 - SAQ with BFR - Supine clamshell with RTB  Patient Education: - HEP was updated and reissued to patient; pt educated on HEP, was provided handout, and verbally confirmed understanding of exercises.   PT Short Term Goals - 02/06/21 0956       PT SHORT TERM GOAL #1   Title Marishka will be >75% HEP compliant to improve carryover between sessions and facilitate independent management of condition    Target Date 02/27/21               PT Long Term Goals - 02/06/21 0956       PT LONG TERM GOAL #1   Title Target date for all long term goals:  04/03/2021      PT LONG TERM GOAL #2   Title Emanie will improve right ankle DF to equivocal category on FMS ankle DF screen (pt stands in tandem stance and performs closed chain DF of rear foot to maximal DF without lifting heel, categories are measured based on vertical line from forward foot's medial malleolus  if patella does not reach line = fail, if patella falls within line = equivocal, if patella exceeds line = pass) to normalize terminal stance phase of gait and reduce stress on posterior ankle structures  EVAL: fail      PT LONG TERM GOAL #3   Title Reyah will improve KOOS Jr score from 21 (on evaluation) to 50 as a proxy for functional improvement      PT LONG TERM GOAL #4   Title Kearra will improve reps of 30'' step up to 8'' box to >/= 6x (right LE(s)) to show improved LE strength and ability to navigate steps  EVAL: L 5x, R 2x (unable to step down)      PT LONG TERM GOAL #5   Title Jeanenne will improve the following MMTs to >/= 4/5 to show improvement in strength:  knee   EVAL: R knee: ext 3/5 w/ pain, flexion 3+/5                   Plan - 02/24/21 1817     Clinical Impression Statement Overall, Dailey is progressing well with therapy.  Pt reports no increase in baseline pain following therapy.  Today we concentrated on knee strengthening and hip strengthening.  Pt shows significant quad, glute (max and min) and PF weakness; these should remain the focus of therapy at this point.  She tolerates BFR well.  Pt will continue to benefit from skilled physical therapy to address remaining deficits and achieve listed goals.  Continue per POC.    Stability/Clinical Decision Making Stable/Uncomplicated    Rehab Potential Fair    PT Frequency 2x / week    PT Duration 8 weeks    PT Treatment/Interventions ADLs/Self Care Home Management;Aquatic Therapy;Iontophoresis 4mg /ml Dexamethasone;Gait training;Therapeutic activities;Therapeutic exercise;Neuromuscular re-education;Manual techniques;Dry needling;Vasopneumatic Device    PT Next Visit Plan progressive knee and LE strengthening    PT Home Exercise Plan [facility]    Consulted and Agree with Plan of Care Patient             Patient will benefit from skilled therapeutic intervention in order to improve the following deficits and impairments:  Abnormal gait, Decreased activity tolerance, Decreased strength, Pain, Impaired flexibility, Difficulty walking, Decreased balance  Visit Diagnosis: Chronic pain of right knee  Muscle weakness  Other abnormalities of gait and mobility     Problem List Patient Active Problem List   Diagnosis Date Noted   Wolff-Parkinson-White (WPW) syndrome 10/27/2020   WPW (Wolff-Parkinson-White syndrome) 10/23/2020   SVT (supraventricular tachycardia) ([place]) 08/29/2020   Elevated heart rate with elevated blood pressure without diagnosis of hypertension 07/14/2020   Microcytosis 07/14/2020   Hypertension    Elevated troponin I level     Nancy Davis, PT 02/24/2021,  6:28 PM  [facility] [address] [place], [place], [address] Phone: [phone]   Fax:  [phone]  Name: Nancy Davis MRN: [id number] Date of Birth: [birth year]

## 2021-02-26 ENCOUNTER — Ambulatory Visit: Payer: Medicaid Other

## 2021-03-02 ENCOUNTER — Ambulatory Visit: Payer: Medicaid Other | Admitting: Physical Therapy

## 2021-03-02 ENCOUNTER — Other Ambulatory Visit: Payer: Self-pay

## 2021-03-02 ENCOUNTER — Encounter: Payer: Self-pay | Admitting: Physical Therapy

## 2021-03-02 ENCOUNTER — Ambulatory Visit: Payer: Medicaid Other

## 2021-03-02 DIAGNOSIS — G8929 Other chronic pain: Secondary | ICD-10-CM

## 2021-03-02 DIAGNOSIS — M25561 Pain in right knee: Secondary | ICD-10-CM | POA: Diagnosis not present

## 2021-03-02 DIAGNOSIS — R2689 Other abnormalities of gait and mobility: Secondary | ICD-10-CM

## 2021-03-02 DIAGNOSIS — M6281 Muscle weakness (generalized): Secondary | ICD-10-CM

## 2021-03-02 NOTE — Therapy (Signed)
[place], [place], [address] Phone: [phone]   Fax:  [phone]  Physical Therapy Treatment  Patient Details  Name: Nancy Davis MRN: [id number] Date of Birth: [birth year] Referring Provider (PT): Georgeanna Harrison, MD   Encounter Date: 03/02/2021   PT End of Session - 03/02/21 1233     Visit Number 3    Number of Visits 16    Date for PT Re-Evaluation 04/03/21    Authorization Type [employer]-auth pending    PT Start Time 5093    PT Stop Time 1315    PT Time Calculation (min) 45 min             Past Medical History:  Diagnosis Date   Anemia    yrs ago   Dislocation of left knee with medial meniscus tear    Hypertension    Knee pain, left    from old injury playing sports in high school,     Past Surgical History:  Procedure Laterality Date   CESAREAN SECTION  2007   KNEE ARTHROSCOPY WITH MEDIAL MENISECTOMY Right 01/01/2020   Procedure: KNEE ARTHROSCOPY WITH MEDIAL MENISECTOMY and chrondroplasty;  Surgeon: Renette Butters, MD;  Location: [place];  Service: Orthopedics;  Laterality: Right;   SVT ABLATION N/A 10/27/2020   Procedure: SVT ABLATION;  Surgeon: Evans Lance, MD;  Location: [place] [facility];  Service: Cardiovascular;  Laterality: N/A;    There were no vitals filed for this visit.   Subjective Assessment - 03/02/21 1233     Subjective Pt reports 0/10 pain on arrival today.             [facility] Adult PT Treatment/Exercise:  Therapeutic Exercise: - nu-step L7 70m while taking subjective and planning session with patient - rocker board D/F P/F - 20x; lateral rocking  -SLS 6-7 sec best bilateral -tandem stance  30 sec  - heel raises on step - 3x10 -- slant board stretch - 3x45'' - SLR - 3x10  - bridge on Pball - 3x10 - Supine clamshell with GTB -Bridge 10 x 2  - supine hamstring stretch with strap 3 x 30 sec  -right heel slide to quad set     [facility] PT Assessment - 03/02/21 0001       PROM   Right Knee Flexion 120                 PT Short Term Goals - 02/06/21 0956       PT SHORT TERM GOAL #1   Title Fotini will be >75% HEP compliant to improve carryover between sessions and facilitate independent management of condition    Target Date 02/27/21               PT Long Term Goals - 02/06/21 0956       PT LONG TERM GOAL #1   Title Target date for all long term goals:  04/03/2021      PT LONG TERM GOAL #2   Title Myrtie will improve right ankle DF to equivocal category on FMS ankle DF screen (pt stands in tandem stance and performs closed chain DF of rear foot to maximal DF without lifting heel, categories are measured based on vertical line from forward foot's medial malleolus  if patella does not reach line = fail, if patella falls within line = equivocal, if patella exceeds line = pass) to normalize terminal stance phase of gait and reduce  stress on posterior ankle structures  EVAL: fail      PT LONG TERM GOAL #3   Title Leeta will improve KOOS Jr score from 21 (on evaluation) to 50 as a proxy for functional improvement      PT LONG TERM GOAL #4   Title Lana will improve reps of 30'' step up to 8'' box to >/= 6x (right LE(s)) to show improved LE strength and ability to navigate steps  EVAL: L 5x, R 2x (unable to step down)      PT LONG TERM GOAL #5   Title Avika will improve the following MMTs to >/= 4/5 to show improvement in strength:  knee  EVAL: R knee: ext 3/5 w/ pain, flexion 3+/5                   Plan - 03/02/21 1307     Clinical Impression Statement Pt arrives after requesting an earlier appointment time with a different therapist. She reports overall improvement in pain with PT thus far. She has improved right knee AROM from 100 to 120 degrees. She has a palpable sore thickness along popliteal fossa. She tolerated RLE strengthening well, with some reports of tenderness in  posterior knee.    PT Treatment/Interventions ADLs/Self Care Home Management;Aquatic Therapy;Iontophoresis 4mg /ml Dexamethasone;Gait training;Therapeutic activities;Therapeutic exercise;Neuromuscular re-education;Manual techniques;Dry needling;Vasopneumatic Device    PT Next Visit Plan progressive knee and LE strengthening    PT Home Exercise Plan [facility]             Patient will benefit from skilled therapeutic intervention in order to improve the following deficits and impairments:  Abnormal gait, Decreased activity tolerance, Decreased strength, Pain, Impaired flexibility, Difficulty walking, Decreased balance  Visit Diagnosis: Chronic pain of right knee  Muscle weakness  Other abnormalities of gait and mobility     Problem List Patient Active Problem List   Diagnosis Date Noted   Wolff-Parkinson-White (WPW) syndrome 10/27/2020   WPW (Wolff-Parkinson-White syndrome) 10/23/2020   SVT (supraventricular tachycardia) ([place]) 08/29/2020   Elevated heart rate with elevated blood pressure without diagnosis of hypertension 07/14/2020   Microcytosis 07/14/2020   Hypertension    Elevated troponin I level     Dorene Ar, PTA 03/02/2021, 1:10 PM  [facility] [address] [place], [place], [address] Phone: [phone]   Fax:  [phone]  Name: Nancy Davis MRN: [id number] Date of Birth: March 17, 1976

## 2021-03-06 ENCOUNTER — Ambulatory Visit: Payer: Medicaid Other | Admitting: Physical Therapy

## 2021-03-06 ENCOUNTER — Encounter: Payer: Self-pay | Admitting: Physical Therapy

## 2021-03-06 ENCOUNTER — Other Ambulatory Visit: Payer: Self-pay

## 2021-03-06 DIAGNOSIS — R2689 Other abnormalities of gait and mobility: Secondary | ICD-10-CM

## 2021-03-06 DIAGNOSIS — M6281 Muscle weakness (generalized): Secondary | ICD-10-CM

## 2021-03-06 DIAGNOSIS — G8929 Other chronic pain: Secondary | ICD-10-CM

## 2021-03-06 DIAGNOSIS — M25561 Pain in right knee: Secondary | ICD-10-CM | POA: Diagnosis not present

## 2021-03-06 NOTE — Therapy (Signed)
[place], [place], [address] Phone: [phone]   Fax:  [phone]  Physical Therapy Treatment  Patient Details  Name: Nancy Davis MRN: [id number] Date of Birth: 01-23-1976 Referring Provider (PT): Georgeanna Harrison, MD   Encounter Date: 03/06/2021   PT End of Session - 03/06/21 1112     Visit Number 4    Number of Visits 16    Date for PT Re-Evaluation 04/03/21    Authorization Type [employer] blue-auth pending    PT Start Time 1115    PT Stop Time 6962    PT Time Calculation (min) 43 min             Past Medical History:  Diagnosis Date   Anemia    yrs ago   Dislocation of left knee with medial meniscus tear    Hypertension    Knee pain, left    from old injury playing sports in high school,     Past Surgical History:  Procedure Laterality Date   CESAREAN SECTION  2007   KNEE ARTHROSCOPY WITH MEDIAL MENISECTOMY Right 01/01/2020   Procedure: KNEE ARTHROSCOPY WITH MEDIAL MENISECTOMY and chrondroplasty;  Surgeon: Renette Butters, MD;  Location: [place];  Service: Orthopedics;  Laterality: Right;   SVT ABLATION N/A 10/27/2020   Procedure: SVT ABLATION;  Surgeon: Evans Lance, MD;  Location: [place] [facility];  Service: Cardiovascular;  Laterality: N/A;    There were no vitals filed for this visit.   Subjective Assessment - 03/06/21 1116     Subjective Pt reports continued improvement in her R knee.  She reports 1/10 R knee pain currently.                               [facility] Adult PT Treatment/Exercise - 03/06/21 0001       Blood Flow Restriction-Positions    Blood Flow Restriction Position Sitting      BFR Sitting   Sitting Limb Occulsion Pressure (mmHg) 250    Sitting Exercise Pressure (mmHg) 185    Sitting Exercise Prescription comment   30x   Sitting Exercise Prescription Comment LAQ in sittin - 2#                    [facility] Adult PT Treatment/Exercise:   Therapeutic Exercise: - nu-step L6 40m while taking subjective and planning session with patient LE only - slant board stretch - 3x45'' - SLS - 30'' bouts - S/L rocker board D/F P/F - 20x - heel raises on step - 2x15 - 4'' step up 2x10  - lat 2x10 - SLR - 3x10  (NT) - bridge 2x10 - LAQ with BFR - 2# (stopped after 30 reps d/t discomfort) - S/L clam with GTB - 2x10 ea     PT Short Term Goals - 03/06/21 1141       PT SHORT TERM GOAL #1   Title Nancy Davis will be >75% HEP compliant to improve carryover between sessions and facilitate independent management of condition    Status Achieved    Target Date 02/27/21               PT Long Term Goals - 02/06/21 0956       PT LONG TERM GOAL #1   Title Target date for all long term goals:  04/03/2021      PT LONG TERM GOAL #2  Title Nancy Davis will improve right ankle DF to equivocal category on FMS ankle DF screen (pt stands in tandem stance and performs closed chain DF of rear foot to maximal DF without lifting heel, categories are measured based on vertical line from forward foot's medial malleolus    if patella does not reach line = fail, if patella falls within line = equivocal, if patella exceeds line = pass) to normalize terminal stance phase of gait and reduce stress on posterior ankle structures    EVAL: fail      PT LONG TERM GOAL #3   Title Nancy Davis will improve KOOS Jr score from 21 (on evaluation) to 50 as a proxy for functional improvement      PT LONG TERM GOAL #4   Title Nancy Davis will improve reps of 30'' step up to 8'' box to >/= 6x (right LE(s)) to show improved LE strength and ability to navigate steps    EVAL: L 5x, R 2x (unable to step down)      PT LONG TERM GOAL #5   Title Nancy Davis will improve the following MMTs to >/= 4/5 to show improvement in strength:  knee    EVAL: R knee: ext 3/5 w/ pain, flexion 3+/5                   Plan - 03/06/21 1140     Clinical  Impression Statement Nancy Davis is progressing well with therapy.  Pt reports no increase in baseline pain following therapy.  Today we concentrated on quad strengthening and hip strengthening.  Pt continues to progress strength.  We were able to progress closed chain strengthening with 4'' step up to good effect.  Pt reports discomfort with LAQ + BFR, so this was d/c'd after 30 reps today.  Pt will continue to benefit from skilled physical therapy to address remaining deficits and achieve listed goals.  Continue per POC.    PT Treatment/Interventions ADLs/Self Care Home Management;Aquatic Therapy;Iontophoresis 4mg /ml Dexamethasone;Gait training;Therapeutic activities;Therapeutic exercise;Neuromuscular re-education;Manual techniques;Dry needling;Vasopneumatic Device    PT Next Visit Plan progressive knee and LE strengthening    PT Home Exercise Plan [facility]             Patient will benefit from skilled therapeutic intervention in order to improve the following deficits and impairments:  Abnormal gait, Decreased activity tolerance, Decreased strength, Pain, Impaired flexibility, Difficulty walking, Decreased balance  Visit Diagnosis: Chronic pain of right knee  Muscle weakness  Other abnormalities of gait and mobility     Problem List Patient Active Problem List   Diagnosis Date Noted   Wolff-Parkinson-White (WPW) syndrome 10/27/2020   WPW (Wolff-Parkinson-White syndrome) 10/23/2020   SVT (supraventricular tachycardia) ([facility]) 08/29/2020   Elevated heart rate with elevated blood pressure without diagnosis of hypertension 07/14/2020   Microcytosis 07/14/2020   Hypertension    Elevated troponin I level     Nancy Davis, PT 03/06/2021, 12:00 PM  [facility] [address] [place], [place], [address] Phone: [phone]   Fax:  [phone]  Name: Nancy Davis MRN: [id number] Date of Birth: 12-20-1975

## 2021-03-09 ENCOUNTER — Ambulatory Visit: Payer: Medicaid Other

## 2021-03-09 ENCOUNTER — Telehealth: Payer: Self-pay

## 2021-03-09 NOTE — Telephone Encounter (Signed)
LVM notifying patient of missed PT appointment and reviewed attendance policy. Reminded patient of next scheduled visit.   Gwendolyn Grant, PT, DPT, ATC 03/09/21 12:29 PM

## 2021-03-13 ENCOUNTER — Ambulatory Visit: Payer: Medicaid Other | Admitting: Physical Therapy

## 2021-03-19 ENCOUNTER — Ambulatory Visit: Payer: Medicaid Other | Admitting: Physician Assistant

## 2021-03-20 ENCOUNTER — Ambulatory Visit: Payer: Medicaid Other | Admitting: Physical Therapy

## 2021-03-24 ENCOUNTER — Ambulatory Visit: Payer: Medicaid Other | Admitting: Physical Therapy

## 2021-03-27 ENCOUNTER — Ambulatory Visit: Payer: Medicaid Other | Attending: Orthopedic Surgery | Admitting: Physical Therapy

## 2021-03-27 ENCOUNTER — Encounter: Payer: Self-pay | Admitting: Physical Therapy

## 2021-03-27 ENCOUNTER — Other Ambulatory Visit: Payer: Self-pay

## 2021-03-27 DIAGNOSIS — M6281 Muscle weakness (generalized): Secondary | ICD-10-CM

## 2021-03-27 DIAGNOSIS — M25561 Pain in right knee: Secondary | ICD-10-CM | POA: Insufficient documentation

## 2021-03-27 DIAGNOSIS — G8929 Other chronic pain: Secondary | ICD-10-CM

## 2021-03-27 DIAGNOSIS — R2689 Other abnormalities of gait and mobility: Secondary | ICD-10-CM | POA: Diagnosis present

## 2021-03-27 NOTE — Therapy (Signed)
[place], [place], [address] Phone: [phone]   Fax:  [phone]  PHYSICAL THERAPY DISCHARGE SUMMARY  Visits from Start of Care: 5  Current functional level related to goals / functional outcomes: See assessment/goals   Remaining deficits: See assessment/goals   Education / Equipment: HEP and D/C plans  Patient agrees to discharge. Patient goals were partially met. Patient is being discharged due to being pleased with the current functional level.  Patient Details  Name: Nancy Davis MRN: [id number] Date of Birth: [birth year] Referring Provider (PT): Georgeanna Harrison, MD   Encounter Date: 03/27/2021   PT End of Session - 03/27/21 1044     Visit Number 5    Number of Visits 16    Date for PT Re-Evaluation 04/03/21    Authorization Type [employer]-auth pending    PT Start Time 0354    PT Stop Time 6568    PT Time Calculation (min) 43 min             Past Medical History:  Diagnosis Date   Anemia    yrs ago   Dislocation of left knee with medial meniscus tear    Hypertension    Knee pain, left    from old injury playing sports in high school,     Past Surgical History:  Procedure Laterality Date   CESAREAN SECTION  2007   KNEE ARTHROSCOPY WITH MEDIAL MENISECTOMY Right 01/01/2020   Procedure: KNEE ARTHROSCOPY WITH MEDIAL MENISECTOMY and chrondroplasty;  Surgeon: Renette Butters, MD;  Location: [facility];  Service: Orthopedics;  Laterality: Right;   SVT ABLATION N/A 10/27/2020   Procedure: SVT ABLATION;  Surgeon: Evans Lance, MD;  Location: [facility];  Service: Cardiovascular;  Laterality: N/A;    There were no vitals filed for this visit.   Subjective Assessment - 03/27/21 1048     Subjective Pt reports that she has been working a lot and this combined with the weather has made her knee sore..  She reports 8/10 R knee pain currently.              Objective:  KOOS, JR. Score: 11 / 28, Interval Score: 59.381 / 100  Knee flex and ext MMT: 4/5  DF: still lacking  30'' 8'' step up R: 8x  [facility] Adult PT Treatment/Exercise:   Therapeutic Exercise: - nu-step L6 19mwhile taking subjective and planning session with patient LE only - slant board stretch - 3x45'' - completing all listed HEP exercises listed below  HEP Access Code: [email or web address] MontgomeryURL: [email or web address] Date: 03/27/2021 Prepared by: KShearon Balo Exercises Gastroc Stretch on Wall - 2 x daily - 7 x weekly - 1 sets - 4 reps - 30 sec hold Clamshell with Resistance - 1 x daily - 7 x weekly - 3 sets - 10 reps Supine Bridge - 1 x daily - 7 x weekly - 3 sets - 10 reps Sitting Knee Extension with Resistance - 1 x daily - 7 x weekly - 3 sets - 10 reps    PT Short Term Goals - 03/06/21 1141       PT SHORT TERM GOAL #1   Title LChinarawill be >75% HEP compliant to improve carryover between sessions and facilitate independent management of condition    Status Achieved    Target Date 02/27/21               PT Long Term Goals -  03/27/21 1054       PT LONG TERM GOAL #1   Title Target date for all long term goals:  04/03/2021      PT LONG TERM GOAL #2   Title Jenavie will improve right ankle DF to equivocal category on FMS ankle DF screen (pt stands in tandem stance and performs closed chain DF of rear foot to maximal DF without lifting heel, categories are measured based on vertical line from forward foot's medial malleolus    if patella does not reach line = fail, if patella falls within line = equivocal, if patella exceeds line = pass) to normalize terminal stance phase of gait and reduce stress on posterior ankle structures    EVAL: fail    Baseline 1/6: NOT MET    Status Not Met      PT LONG TERM GOAL #3   Title Persephanie will improve KOOS Jr score from 21 (on evaluation) to 50 as a proxy for functional improvement    Baseline 03/27/21: 59.381 / 100     Status Achieved      PT LONG TERM GOAL #4   Title Haylee will improve reps of 30'' step up to 8'' box to >/= 6x (right LE(s)) to show improved LE strength and ability to navigate steps    EVAL: L 5x, R 2x (unable to step down)    Baseline 03/27/20: 8x    Status Achieved      PT LONG TERM GOAL #5   Title Airyanna will improve the following MMTs to >/= 4/5 to show improvement in strength:  knee    EVAL: R knee: ext 3/5 w/ pain, flexion 3+/5    Baseline 1/6: 4/5    Status Achieved                   Plan - 03/27/21 1110     Clinical Impression Statement BLOSSIE RAFFEL has progressed well with therapy.  Improved impairments include: knee strength and LE strength, reduced pain.  Functional improvements include: ambulation, reduced pain at work, step navigation, and transfers.  Progressions needed include: continued work at home via [employer].  Barriers to progress include: none.  Please see baseline and/or status section in "Goals" for specific progress on short term and long term goals established at evaluation.  I recommend D/C home with HEP; pt agrees with plan.    PT Treatment/Interventions ADLs/Self Care Home Management;Aquatic Therapy;Iontophoresis 8m/ml Dexamethasone;Gait training;Therapeutic activities;Therapeutic exercise;Neuromuscular re-education;Manual techniques;Dry needling;Vasopneumatic Device    PT Next Visit Plan progressive knee and LE strengthening    [facility]            Patient will benefit from skilled therapeutic intervention in order to improve the following deficits and impairments:  Abnormal gait, Decreased activity tolerance, Decreased strength, Pain, Impaired flexibility, Difficulty walking, Decreased balance  Visit Diagnosis: Chronic pain of right knee  Muscle weakness  Other abnormalities of gait and mobility     Problem List Patient Active Problem List   Diagnosis Date Noted   Wolff-Parkinson-White (WPW) syndrome 10/27/2020    WPW (Wolff-Parkinson-White syndrome) 10/23/2020   SVT (supraventricular tachycardia) ([place] 08/29/2020   Elevated heart rate with elevated blood pressure without diagnosis of hypertension 07/14/2020   Microcytosis 07/14/2020   Hypertension    Elevated troponin I level     KMathis Dad PT 03/27/2021, 11:12 AM  [facility] [address]Phone: [phone]  Fax:  [phone]  Name: REGENA DELUCCHI MRN: [id number] Date of Birth: 23-Oct-1975

## 2021-03-29 IMAGING — CT CT ANGIO CHEST
2 of 6 series · 19 of 36 positions shown · IV contrast (omnipaque)
Comparison: Radiograph of same day.

CLINICAL DATA: Chest pain, hemoptysis.

EXAM:
CT ANGIOGRAPHY CHEST WITH CONTRAST
TECHNIQUE: Multidetector CT imaging of the chest was performed using the
standard protocol during bolus administration of intravenous
contrast. Multiplanar CT image reconstructions and MIPs were
obtained to evaluate the vascular anatomy.
CONTRAST:  80mL OMNIPAQUE IOHEXOL 350 MG/ML SOLN

[Series 7: pe thins · axial · 0.64mm/px · z∈[-231,+0]mm · 18 of 368 slices shown]
[im 19/368  lung]
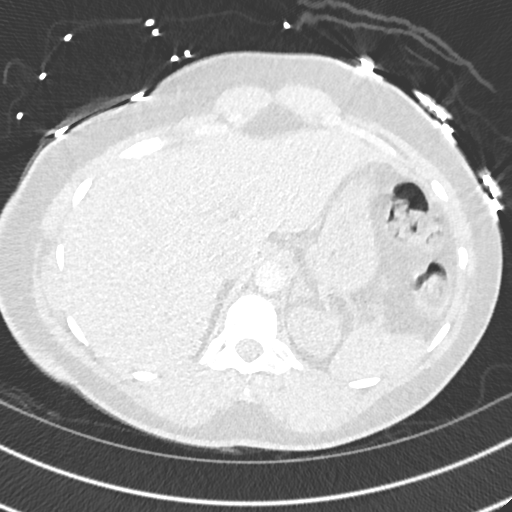
[im 37/368  mediastinal]
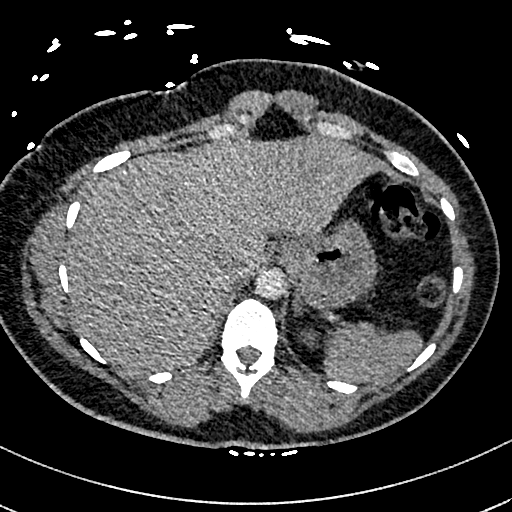
[im 56/368  lung]
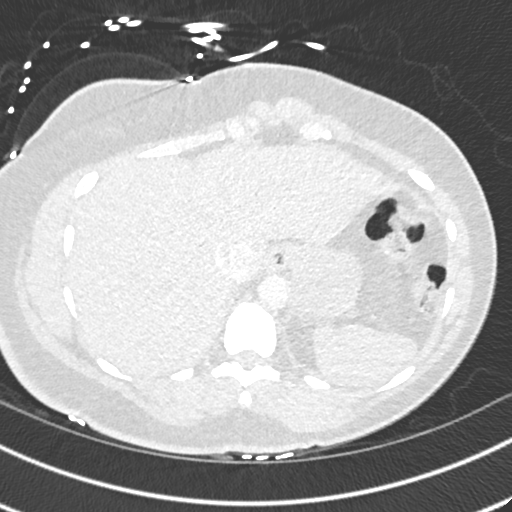
[im 74/368  mediastinal]
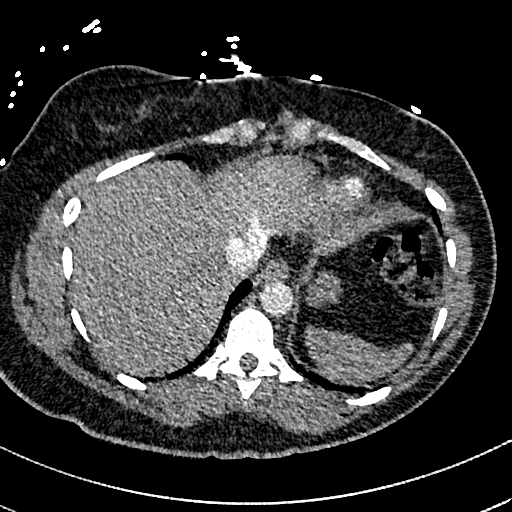
[im 92/368  lung]
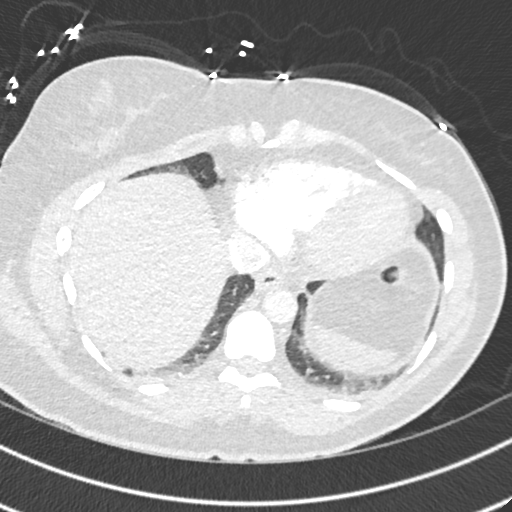
[im 111/368  mediastinal]
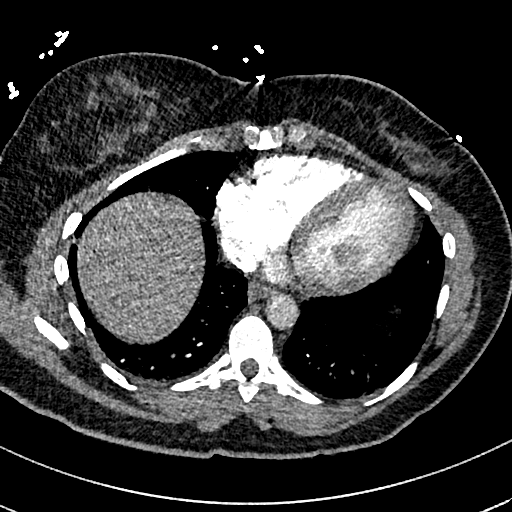
[im 129/368  lung]
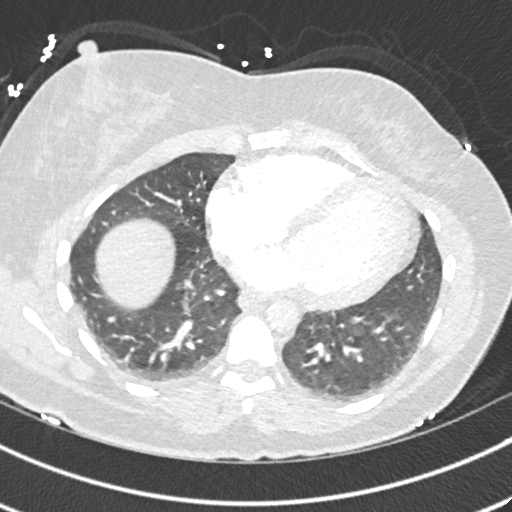
[im 147/368  mediastinal]
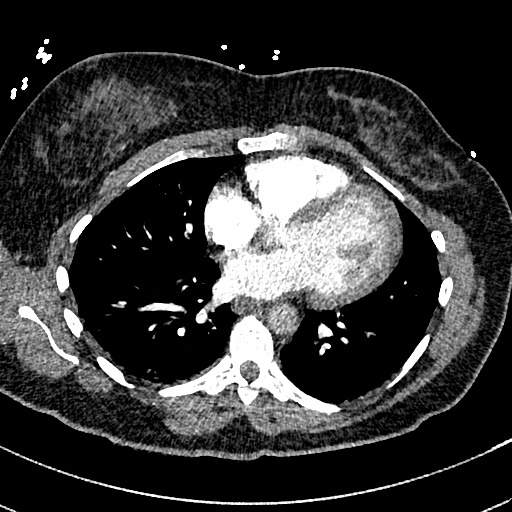
[im 166/368  lung]
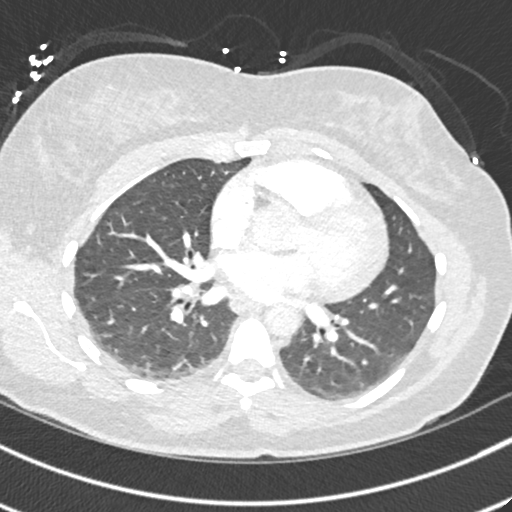
[im 202/368  mediastinal]
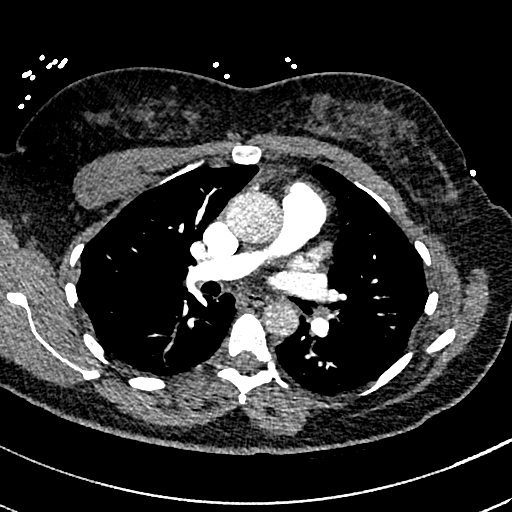
[im 221/368  lung]
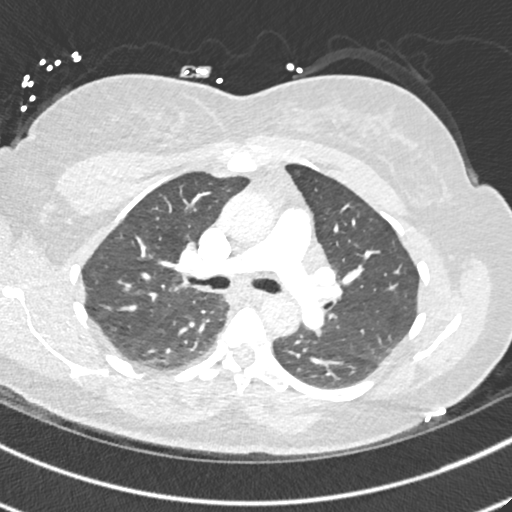
[im 239/368  mediastinal]
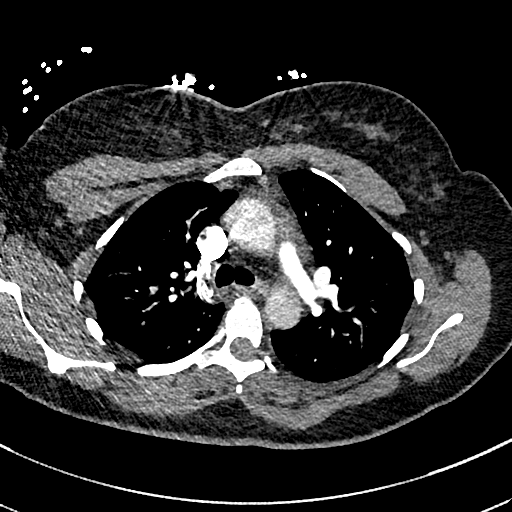
[im 257/368  lung]
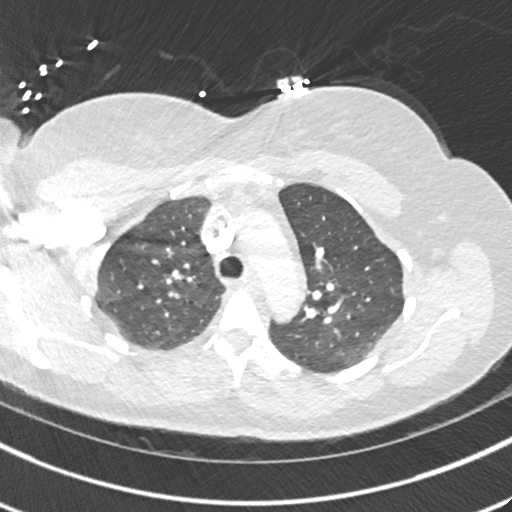
[im 276/368  mediastinal]
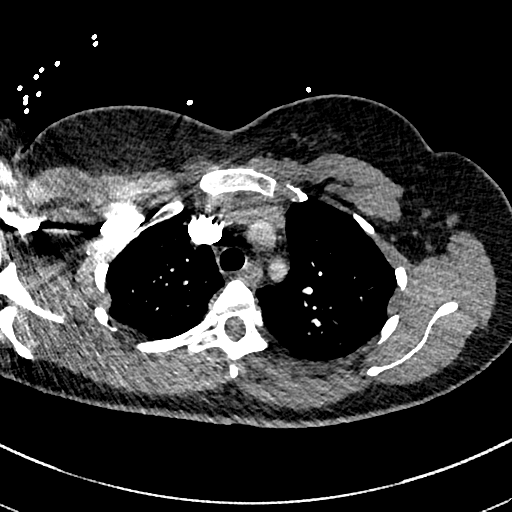
[im 294/368  lung]
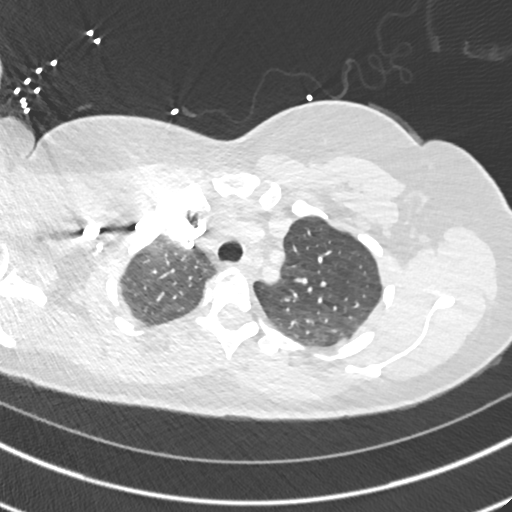
[im 312/368  mediastinal]
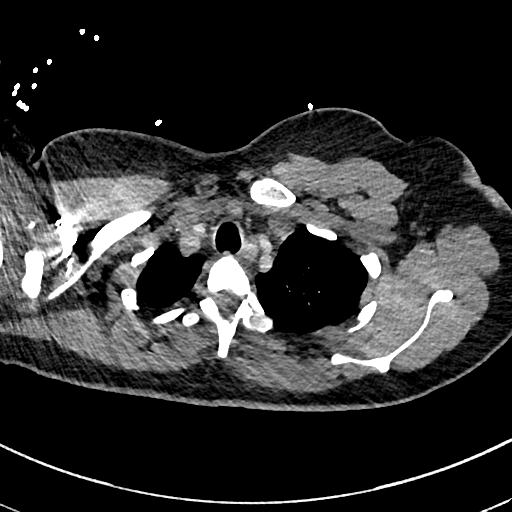
[im 331/368  lung]
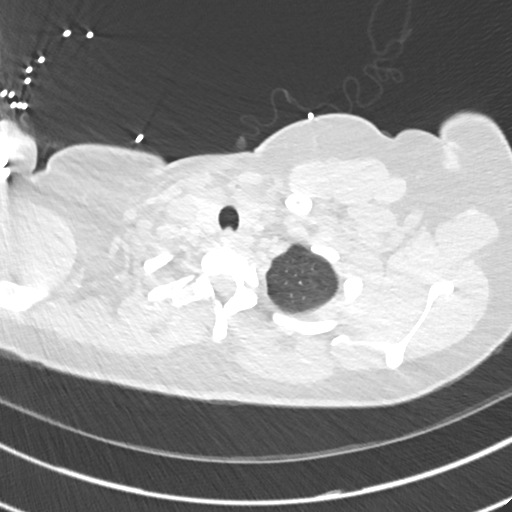
[im 349/368  mediastinal]
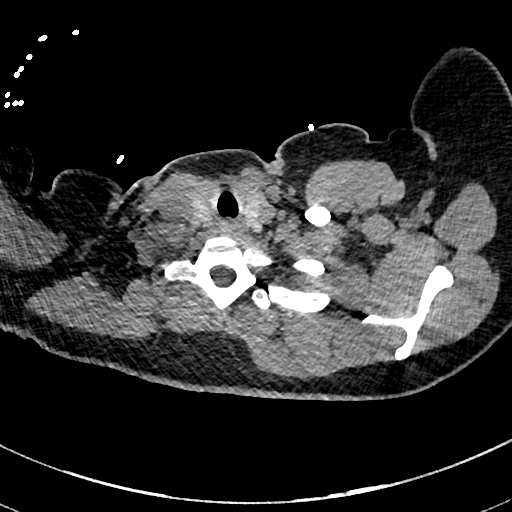

[Series 8: pe 2mm cor · coronal · 0.52mm/px · 1 of 130 slices shown]
[im 65/130  mediastinal]
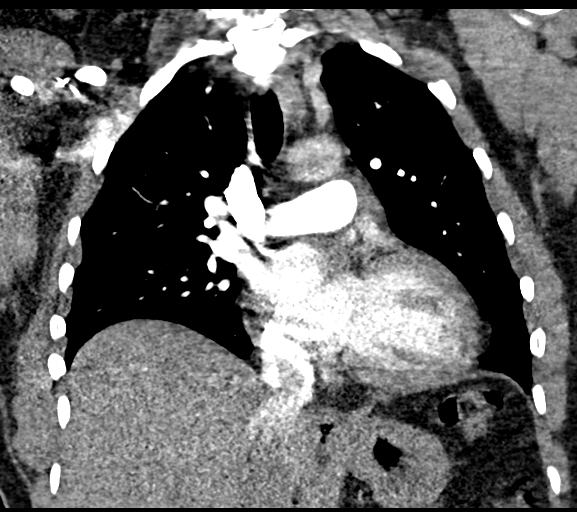

[19 of 36 positions shown; findings below may reference images not displayed]

FINDINGS: Cardiovascular: Satisfactory opacification of the pulmonary arteries
to the segmental level. No evidence of pulmonary embolism. Normal
heart size. No pericardial effusion.

Mediastinum/Nodes: No enlarged mediastinal, hilar, or axillary lymph
nodes. Thyroid gland, trachea, and esophagus demonstrate no
significant findings.

Lungs/Pleura: Lungs are clear. No pleural effusion or pneumothorax.

Upper Abdomen: No acute abnormality.

Musculoskeletal: No chest wall abnormality. No acute or significant
osseous findings.

Review of the MIP images confirms the above findings.
IMPRESSION: No definite evidence of pulmonary embolus. No acute cardiopulmonary
abnormality seen.

## 2021-04-06 NOTE — Progress Notes (Deleted)
Cardiology Office Note:    Date:  04/06/2021   ID:  Nancy Davis, DOB April 09, 1975, MRN [id number]  PCP:  Default, Provider, MD [facility] Cardiologist: Pixie Casino, MD   Reason for visit: 81-month follow-up  History of Present Illness:    Nancy Davis is a 46 y.o. female with a hx of hypertension and tachycardia.  She presented to the ED in April 2022 with tachycardia and chest pain.  On presentation, her heart rate was in 180s which improved after vagal maneuver.  Her chest pain resolved.  Echo showed normal EF.  Nuclear stress test was negative for ischemia.  Heart monitor showed short runs of nonsustained VT and paroxysmal SVT.  She saw [name] in August 2022.  Patient had improved her diet and started a walking program.  Patient was losing weight and had improvement in her chest pain and palpitations.  With blood pressure 144/88, her amlodipine was increased.  With LDL 128 and family history of premature heart disease, she was started on Crestor 5 mg daily.  She underwent a successful SVT ablation (right mid septal accessory pathway) with Dr. Crissie Sickles in August 2022.   Today, ***  Hypertension -*** -Goal BP is <130/80.  Recommend DASH diet (high in vegetables, fruits, low-fat dairy products, whole grains, poultry, fish, and nuts and low in sweets, sugar-sweetened beverages, and red meats), salt restriction and increase physical activity.  Hyperlipidemia -*** -Discussed cholesterol lowering diets - Mediterranean diet, DASH diet, vegetarian diet, low-carbohydrate diet and avoidance of trans fats.  Discussed healthier choice substitutes.  Nuts, high-fiber foods, and fiber supplements may also improve lipids.    Obesity -Discussed how even a 5-10% weight loss can have cardiovascular benefits.   -Recommend moderate intensity activity for 30 minutes 5 days/week and the DASH diet.  Wolff-Parkinson-White syndrome -status post catheter ablation by Dr. Cristopher Peru.  Seen 02/2021 & doing well with no evidence of recurrence of her accessory pathway.  Patient weaned off of her beta-blocker.  Disposition - Follow-up in ***      Past Medical History:  Diagnosis Date   Anemia    yrs ago   Dislocation of left knee with medial meniscus tear    Hypertension    Knee pain, left    from old injury playing sports in high school,     Past Surgical History:  Procedure Laterality Date   CESAREAN SECTION  2007   KNEE ARTHROSCOPY WITH MEDIAL MENISECTOMY Right 01/01/2020   Procedure: KNEE ARTHROSCOPY WITH MEDIAL MENISECTOMY and chrondroplasty;  Surgeon: Renette Butters, MD;  Location: [facility];  Service: Orthopedics;  Laterality: Right;   SVT ABLATION N/A 10/27/2020   Procedure: SVT ABLATION;  Surgeon: Evans Lance, MD;  Location: [facility];  Service: Cardiovascular;  Laterality: N/A;    Current Medications: No outpatient medications have been marked as taking for the 04/07/21 encounter (Appointment) with [name], PA-C.     Allergies:   Penicillins and Coconut oil   Social History   Socioeconomic History   Marital status: Single    Spouse name: Not on file   Number of children: Not on file   Years of education: Not on file   Highest education level: Not on file  Occupational History   Not on file  Tobacco Use   Smoking status: Never   Smokeless tobacco: Never  Vaping Use   Vaping Use: Never used  Substance and Sexual Activity  Alcohol use: No   Drug use: No   Sexual activity: Not on file  Other Topics Concern   Not on file  Social History Narrative   Not on file   Social Determinants of Health   Financial Resource Strain: Not on file  Food Insecurity: Not on file  Transportation Needs: Not on file  Physical Activity: Not on file  Stress: Not on file  Social Connections: Not on file     Family History: The patient's family history is not on file.  ROS:   Please see the history  of present illness.     EKGs/Labs/Other Studies Reviewed:    EKG:  The ekg ordered today demonstrates ***  Recent Labs: 07/13/2020: ALT 17 07/14/2020: Magnesium 1.8; TSH 2.380 10/09/2020: BUN 20; Creatinine, Ser 0.70; Hemoglobin 10.9; Platelets 269; Potassium 4.0; Sodium 138   Recent Lipid Panel Lab Results  Component Value Date/Time   CHOL 191 07/14/2020 09:42 AM   TRIG 75 07/14/2020 09:42 AM   HDL 48 07/14/2020 09:42 AM   LDLCALC 128 (H) 07/14/2020 09:42 AM    Physical Exam:    VS:  There were no vitals taken for this visit.   No data found.  Wt Readings from Last 3 Encounters:  02/23/21 183 lb 6.4 oz (83.2 kg)  10/27/20 194 lb (88 kg)  10/23/20 194 lb 3.2 oz (88.1 kg)     GEN: *** Well nourished, well developed in no acute distress HEENT: Normal NECK: No JVD; No carotid bruits CARDIAC: ***RRR, no murmurs, rubs, gallops RESPIRATORY:  Clear to auscultation without rales, wheezing or rhonchi  ABDOMEN: Soft, non-tender, non-distended MUSCULOSKELETAL: No edema; No deformity  SKIN: Warm and dry NEUROLOGIC:  Alert and oriented PSYCHIATRIC:  Normal affect     ASSESSMENT AND PLAN   ***   {Are you ordering a CV Procedure (e.g. stress test, cath, DCCV, TEE, etc)?   Press F2        :[id number]}    Medication Adjustments/Labs and Tests Ordered: Current medicines are reviewed at length with the patient today.  Concerns regarding medicines are outlined above.  No orders of the defined types were placed in this encounter.  No orders of the defined types were placed in this encounter.   There are no Patient Instructions on file for this visit.   Signed, [name], PA-C  04/06/2021 10:10 PM    [facility]

## 2021-04-07 ENCOUNTER — Ambulatory Visit: Payer: Medicaid Other | Admitting: Physician Assistant

## 2021-04-07 DIAGNOSIS — E785 Hyperlipidemia, unspecified: Secondary | ICD-10-CM

## 2021-04-07 DIAGNOSIS — I10 Essential (primary) hypertension: Secondary | ICD-10-CM

## 2021-04-07 DIAGNOSIS — I456 Pre-excitation syndrome: Secondary | ICD-10-CM

## 2021-04-07 DIAGNOSIS — E669 Obesity, unspecified: Secondary | ICD-10-CM

## 2021-06-20 ENCOUNTER — Emergency Department (HOSPITAL_BASED_OUTPATIENT_CLINIC_OR_DEPARTMENT_OTHER)
Admit: 2021-06-20 | Discharge: 2021-06-20 | Disposition: A | Discharge status: Home / Self Care | Payer: Medicaid Other | Attending: Emergency Medicine | Admitting: Emergency Medicine

## 2021-06-20 ENCOUNTER — Emergency Department (HOSPITAL_COMMUNITY): Payer: Medicaid Other

## 2021-06-20 ENCOUNTER — Other Ambulatory Visit: Payer: Self-pay

## 2021-06-20 ENCOUNTER — Encounter (HOSPITAL_COMMUNITY): Payer: Self-pay | Admitting: Emergency Medicine

## 2021-06-20 ENCOUNTER — Emergency Department (HOSPITAL_COMMUNITY)
Admission: EM | Admit: 2021-06-20 | Discharge: 2021-06-20 | Disposition: A | Discharge status: Home / Self Care | Payer: Medicaid Other | Attending: Emergency Medicine | Admitting: Emergency Medicine

## 2021-06-20 DIAGNOSIS — I10 Essential (primary) hypertension: Secondary | ICD-10-CM | POA: Diagnosis not present

## 2021-06-20 DIAGNOSIS — M25561 Pain in right knee: Secondary | ICD-10-CM | POA: Diagnosis not present

## 2021-06-20 DIAGNOSIS — M7989 Other specified soft tissue disorders: Secondary | ICD-10-CM | POA: Insufficient documentation

## 2021-06-20 DIAGNOSIS — Z79899 Other long term (current) drug therapy: Secondary | ICD-10-CM | POA: Insufficient documentation

## 2021-06-20 LAB — CBC WITH DIFFERENTIAL/PLATELET
Abs Immature Granulocytes: 0.01 10*3/uL (ref 0.00–0.07)
Basophils Absolute: 0.1 10*3/uL (ref 0.0–0.1)
Basophils Relative: 1 %
Eosinophils Absolute: 0.1 10*3/uL (ref 0.0–0.5)
Eosinophils Relative: 1 %
HCT: 36.5 % (ref 36.0–46.0)
Hemoglobin: 11.3 g/dL — ABNORMAL LOW (ref 12.0–15.0)
Immature Granulocytes: 0 %
Lymphocytes Relative: 40 %
Lymphs Abs: 2.3 10*3/uL (ref 0.7–4.0)
MCH: 21 pg — ABNORMAL LOW (ref 26.0–34.0)
MCHC: 31 g/dL (ref 30.0–36.0)
MCV: 67.7 fL — ABNORMAL LOW (ref 80.0–100.0)
Monocytes Absolute: 0.7 10*3/uL (ref 0.1–1.0)
Monocytes Relative: 13 %
Neutro Abs: 2.7 10*3/uL (ref 1.7–7.7)
Neutrophils Relative %: 45 %
Platelets: 267 10*3/uL (ref 150–400)
RBC: 5.39 MIL/uL — ABNORMAL HIGH (ref 3.87–5.11)
RDW: 15.9 % — ABNORMAL HIGH (ref 11.5–15.5)
WBC: 5.9 10*3/uL (ref 4.0–10.5)
nRBC: 0 % (ref 0.0–0.2)

## 2021-06-20 LAB — BASIC METABOLIC PANEL
Anion gap: 5 (ref 5–15)
BUN: 18 mg/dL (ref 6–20)
CO2: 27 mmol/L (ref 22–32)
Calcium: 9 mg/dL (ref 8.9–10.3)
Chloride: 105 mmol/L (ref 98–111)
Creatinine, Ser: 0.64 mg/dL (ref 0.44–1.00)
GFR, Estimated: >60 mL/min (ref >60)
Glucose, Bld: 89 mg/dL (ref 70–99)
Potassium: 3.9 mmol/L (ref 3.5–5.1)
Sodium: 137 mmol/L (ref 135–145)

## 2021-06-20 LAB — URIC ACID: Uric Acid, Serum: 4.4 mg/dL (ref 2.5–7.1)

## 2021-06-20 MED ORDER — NAPROXEN 500 MG PO TABS: 500.0000 mg | ORAL_TABLET | Freq: Two times a day (BID) | ORAL | 0 refills | Status: DC

## 2021-06-20 MED ORDER — OXYCODONE-ACETAMINOPHEN 5-325 MG PO TABS
1.0000 | ORAL_TABLET | Freq: Once | ORAL | Status: AC
  Administered 2021-06-20: 1 via ORAL
  Filled 2021-06-20: qty 1

## 2021-06-20 MED ORDER — DICLOFENAC SODIUM 1 % EX GEL: 2.0000 g | Freq: Four times a day (QID) | CUTANEOUS | 0 refills | Status: AC

## 2021-06-20 NOTE — ED Triage Notes (Signed)
Complains of knee pain and swelling for the past week, denies any resent injuries, falls or trauma. Reports pain with walking but even more pain with lateral movement.  ?

## 2021-06-20 NOTE — Progress Notes (Signed)
Orthopedic Tech Progress Note ?Patient Details:  ?Nancy Davis ?07-07-1975 ?[id number] ? ?Patient ID: Nancy Davis, female   DOB: [birth year], 46 y.o.   MRN: [id number] ? ?Nancy Davis ?06/20/2021, 3:35 PM ?Right knee brace ?

## 2021-06-20 NOTE — Discharge Instructions (Addendum)
You were seen in the emergency department today for knee pain.  You likely have arthritis in the knee.  I provided you with a knee brace.  Use this when you are walking for support.  Please also ice your knee multiple times a day for 15 to 20 minutes at a time.  You may also use naproxen or Voltaren gel for relief of symptoms.  When you are resting at home please elevate your leg.  Please return to the emergency department if your knee becomes red swollen or you begin to have fever with the knee pain. ?

## 2021-06-20 NOTE — ED Notes (Signed)
Patient has a blue top in the main lab °

## 2021-06-20 NOTE — Progress Notes (Signed)
Orthopedic Tech Progress Note ?Patient Details:  ?Nancy Davis ?[birth year] ?[id number] ? ?Knee brace  ?  ?  ? ?[place] Pink ?06/20/2021, 3:31 PM ?Knee brace ?

## 2021-06-20 NOTE — ED Provider Notes (Signed)
?[facility] DEPT ?Provider Note ? ? ?CSN: [id number] ?Arrival date & time: 06/20/21  1151 ? ?  ? ?History ? ?Chief Complaint  ?Patient presents with  ? Knee Pain  ? ? ?Nancy Davis is a 46 y.o. female.  With past medical history of Wolff-Parkinson-White, hypertension who presents to the emergency department with right knee pain. ? ?She states that for 1 week she has had constant, worsening right knee pain.  Describes the pain as burning.  She states that she has had swelling to the knee and intermittent swelling of the right ankle.  She denies falling or trauma to the knee.  She denies any twisting motion or sprain that could have happened.  She states she had a previous injury to the knee but that was in college and is never bothered her since then.  She denies any redness to the knee or fevers.  No recent travel, immobilization, tobacco use, estrogen use, previous history of DVT. ? ? ?Knee Pain ?Associated symptoms: no fever   ? ?  ? ?Home Medications ?Prior to Admission medications   ?Medication Sig Start Date End Date Taking? Authorizing Provider  ?acetaminophen (TYLENOL) 500 MG tablet Take 500 mg by mouth every 6 (six) hours as needed for mild pain or moderate pain.    [provider]  ?amLODipine (NORVASC) 10 MG tablet Take 1 tablet (10 mg total) by mouth daily. 10/23/20   Ledora Bottcher, PA  ?diclofenac Sodium (VOLTAREN) 1 % GEL Apply 2 g topically 4 (four) times daily as needed (knee pain). 06/20/20   Long, Wonda Olds, MD  ?lisinopril-hydrochlorothiazide (ZESTORETIC) 10-12.5 MG tablet Take 1 tablet by mouth daily. 07/14/20 02/23/21  Cherene Altes, MD  ?meloxicam (MOBIC) 15 MG tablet Take 15 mg by mouth daily. 01/21/21   [provider]  ?Multiple Vitamins-Minerals (MULTIVITAMIN WITH MINERALS) tablet Take 1 tablet by mouth daily. Woman    [provider]  ?naphazoline-pheniramine (VISINE) 0.025-0.3 % ophthalmic solution Place 1 drop into both eyes 4  (four) times daily as needed for eye irritation.    [provider]  ?naproxen (NAPROSYN) 500 MG tablet Take 1 tablet (500 mg total) by mouth 2 (two) times daily. 12/16/20   Sherrill Raring, PA-C  ?OVER THE COUNTER MEDICATION Take by mouth daily. Vitamin b 12 sl liquid    [provider]  ?rosuvastatin (CRESTOR) 5 MG tablet Take 1 tablet (5 mg total) by mouth daily. 08/12/20 11/10/20  Ledora Bottcher, PA  ?   ? ?Allergies    ?Penicillins and Coconut oil   ? ?Review of Systems   ?Review of Systems  ?Constitutional:  Negative for fever.  ?Respiratory:  Negative for shortness of breath.   ?Musculoskeletal:  Positive for arthralgias, joint swelling and myalgias.  ?All other systems reviewed and are negative. ? ?Physical Exam ?Updated Vital Signs ?BP (!) 158/79 (BP Location: Left Arm)   Pulse 78   Temp 98.2 ?F (36.8 ?C) (Oral)   Resp 18   Ht '5\' 9"'$  (1.753 m)   Wt 83.2 kg   SpO2 98%   BMI 27.09 kg/m?  ?Physical Exam ?Vitals and nursing note reviewed.  ?Constitutional:   ?   General: She is not in acute distress. ?   Appearance: Normal appearance. She is not ill-appearing or toxic-appearing.  ?HENT:  ?   Head: Normocephalic and atraumatic.  ?Eyes:  ?   General: No scleral icterus. ?   Extraocular Movements: Extraocular movements intact.  ?Cardiovascular:  ?  Pulses: Normal pulses.  ?Pulmonary:  ?   Effort: Pulmonary effort is normal. No respiratory distress.  ?Musculoskeletal:     ?   General: Swelling and tenderness present. No deformity or signs of injury.  ?   Cervical back: Neck supple.  ?   Right knee: No erythema or ecchymosis. Decreased range of motion. Tenderness present over the medial joint line, lateral joint line and PCL. Normal pulse.  ?   Comments: Right knee with perhaps mild swelling compared to the left.  She has tenderness to palpation of the medial and lateral joint line as well as the posterior knee and calf ?Compartments soft ?DP pulse 2+ ?Sensation intact, cap refill less than 2  seconds  ?Skin: ?   General: Skin is warm and dry.  ?   Capillary Refill: Capillary refill takes less than 2 seconds.  ?   Findings: No bruising, erythema or rash.  ?Neurological:  ?   General: No focal deficit present.  ?   Mental Status: She is alert and oriented to person, place, and time. Mental status is at baseline.  ?   Sensory: No sensory deficit.  ?Psychiatric:     ?   Mood and Affect: Mood normal.     ?   Behavior: Behavior normal.     ?   Thought Content: Thought content normal.     ?   Judgment: Judgment normal.  ? ? ?ED Results / Procedures / Treatments   ?Labs ?(all labs ordered are listed, but only abnormal results are displayed) ?Labs Reviewed  ?CBC WITH DIFFERENTIAL/PLATELET - Abnormal; Notable for the following components:  ?    Result Value  ? RBC 5.39 (*)   ? Hemoglobin 11.3 (*)   ? MCV 67.7 (*)   ? MCH 21.0 (*)   ? RDW 15.9 (*)   ? All other components within normal limits  ?BASIC METABOLIC PANEL  ?URIC ACID  ? ? ?EKG ?None ? ?Radiology ?DG Knee Complete 4 Views Right ? ?Result Date: 06/20/2021 ?CLINICAL DATA:  Right knee pain and swelling. EXAM: RIGHT KNEE - COMPLETE 4+ VIEW COMPARISON:  12/26/2020 and 12/16/2020 FINDINGS: No fracture or dislocation. There may be a small amount of suprapatellar joint fluid. Stable tricompartmental degenerative disease most significantly involving the medial compartment. No bony lesions. IMPRESSION: Possible small suprapatellar joint effusion. Otherwise stable tricompartmental degenerative disease. Electronically Signed   By: Aletta Edouard M.D.   On: 06/20/2021 12:44  ? ?VAS [place] LOWER EXTREMITY VENOUS (DVT) (7a-7p) ? ?Result Date: 06/20/2021 ? Lower Venous DVT Study Patient Name:  Nancy Davis  Date of Exam:   06/20/2021 Medical Rec #: [id number]        Accession #:    [id number] Date of Birth: 05-Aug-1975       Patient Gender: F Patient Age:   31 years Exam Location:  [facility] Procedure:      VAS [place] LOWER EXTREMITY VENOUS (DVT) Referring Phys: Theodis Blaze --------------------------------------------------------------------------------  Indications: Right knee pain.  Comparison Study: No prior study Performing Technologist: Maudry Mayhew MHA, RDMS, RVT, RDCS  Examination Guidelines: A complete evaluation includes B-mode imaging, spectral Doppler, color Doppler, and power Doppler as needed of all accessible portions of each vessel. Bilateral testing is considered an integral part of a complete examination. Limited examinations for reoccurring indications may be performed as noted. The reflux portion of the exam is performed with the patient in reverse Trendelenburg.  +---------+---------------+---------+-----------+----------+--------------+ RIGHT    CompressibilityPhasicitySpontaneityPropertiesThrombus Aging +---------+---------------+---------+-----------+----------+--------------+  CFV      Full           Yes      Yes                                 +---------+---------------+---------+-----------+----------+--------------+ SFJ      Full                                                        +---------+---------------+---------+-----------+----------+--------------+ FV Prox  Full                                                        +---------+---------------+---------+-----------+----------+--------------+ FV Mid   Full                                                        +---------+---------------+---------+-----------+----------+--------------+ FV DistalFull                                                        +---------+---------------+---------+-----------+----------+--------------+ PFV      Full                                                        +---------+---------------+---------+-----------+----------+--------------+ POP      Full           Yes      Yes                                 +---------+---------------+---------+-----------+----------+--------------+ PTV      Full                                                         +---------+---------------+---------+-----------+----------+--------------+ PERO     Full                                                        +---------+---------------+---------+-----------+----------+--------------+

## 2021-06-20 NOTE — Progress Notes (Signed)
Right lower extremity venous duplex completed. ?Refer to "CV Proc" under chart review to view preliminary results. ? ?06/20/2021 2:38 PM ?Kelby Aline., MHA, RVT, RDCS, RDMS   ?

## 2021-10-16 ENCOUNTER — Emergency Department (HOSPITAL_COMMUNITY)
Admission: EM | Admit: 2021-10-16 | Discharge: 2021-10-16 | Disposition: A | Discharge status: Home / Self Care | Payer: Medicaid Other | Attending: Emergency Medicine | Admitting: Emergency Medicine

## 2021-10-16 ENCOUNTER — Encounter (HOSPITAL_COMMUNITY): Payer: Self-pay | Admitting: Emergency Medicine

## 2021-10-16 ENCOUNTER — Other Ambulatory Visit: Payer: Self-pay

## 2021-10-16 ENCOUNTER — Emergency Department (HOSPITAL_COMMUNITY): Payer: Medicaid Other

## 2021-10-16 DIAGNOSIS — Z79899 Other long term (current) drug therapy: Secondary | ICD-10-CM | POA: Insufficient documentation

## 2021-10-16 DIAGNOSIS — M25561 Pain in right knee: Secondary | ICD-10-CM | POA: Insufficient documentation

## 2021-10-16 DIAGNOSIS — I10 Essential (primary) hypertension: Secondary | ICD-10-CM | POA: Insufficient documentation

## 2021-10-16 MED ORDER — KETOROLAC TROMETHAMINE 30 MG/ML IJ SOLN
30.0000 mg | Freq: Once | INTRAMUSCULAR | Status: AC
  Administered 2021-10-16: 30 mg via INTRAMUSCULAR
  Filled 2021-10-16: qty 1

## 2021-10-16 MED ORDER — IBUPROFEN 800 MG PO TABS: 800.0000 mg | ORAL_TABLET | Freq: Three times a day (TID) | ORAL | 0 refills | Status: AC

## 2021-10-16 NOTE — ED Provider Triage Note (Signed)
Emergency Medicine Provider Triage Evaluation Note  Nancy Davis , a 46 y.o. female  was evaluated in triage.  Pt complains of right knee pain.  Patient states that over the past 2 weeks she has had worsening right knee pain.  She describes it as a stabbing pain that is constant and worse with ambulating..  She does have a history of meniscal tear and repair and debridement of the knee years ago.  States she has not had issues since then.  She denies any redness or swelling.  Denies any fevers.  Review of Systems  Positive:  Negative:   Physical Exam  BP (!) 147/89 (BP Location: Left Arm)   Pulse 83   Temp 98.2 F (36.8 C) (Oral)   Resp 18   SpO2 99%  Gen:   Awake, no distress   Resp:  Normal effort  MSK:   Right knee slightly more swollen than the left.  Compartments soft.  I do not palpate an effusion.  There is no redness or warmth.  Slightly decreased strength to extension and flexion of the knee Other:    Medical Decision Making  Medically screening exam initiated at 12:09 PM.  Appropriate orders placed.  Nancy Davis was informed that the remainder of the evaluation will be completed by another provider, this initial triage assessment does not replace that evaluation, and the importance of remaining in the ED until their evaluation is complete.     Mickie Hillier, PA-C 10/16/21 [phone]

## 2021-10-16 NOTE — ED Triage Notes (Signed)
Pt reports right knee pain x a while now. Pt reports ice and elevation helps

## 2021-10-16 NOTE — Progress Notes (Signed)
Orthopedic Tech Progress Note Patient Details:  Nancy Davis 13-Dec-1975 [id number]  Ortho Devices Type of Ortho Device: Knee Immobilizer Ortho Device/Splint Location: Right knee Ortho Device/Splint Interventions: Application   Post Interventions Patient Tolerated: Well  Nancy Davis 10/16/2021, 2:44 PM

## 2021-10-16 NOTE — ED Provider Notes (Signed)
[facility] DEPT Provider Note   CSN: [id number] Arrival date & time: 10/16/21  1140     History  Chief Complaint  Patient presents with   Knee Pain    Nancy Davis is a 46 y.o. female.   Knee Pain   Patient with medical history of hypertension, anemia presents today due to right knee pain.  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  Denies any direct trauma or insult.  The pain is worse with any internal rotation of the knee, the pain does not radiate elsewhere.  Worse in the front part.  Denies any skin discoloration, fevers, chills, numbness, redness.  She has tried ice and elevation, 400 of ibuprofen, and Voltaren gel.  Some improvement but not significant.  She follows with Dr. Fulton Mole for orthopedics.   Home Medications Prior to Admission medications   Medication Sig Start Date End Date Taking? Authorizing Provider  acetaminophen (TYLENOL) 500 MG tablet Take 500 mg by mouth every 6 (six) hours as needed for mild pain or moderate pain.    [provider]  amLODipine (NORVASC) 10 MG tablet Take 1 tablet (10 mg total) by mouth daily. 10/23/20   Duke, Tami Lin, PA  diclofenac Sodium (VOLTAREN) 1 % GEL Apply 2 g topically 4 (four) times daily. 06/20/21   Mickie Hillier, PA-C  lisinopril-hydrochlorothiazide (ZESTORETIC) 10-12.5 MG tablet Take 1 tablet by mouth daily. 07/14/20 02/23/21  Cherene Altes, MD  meloxicam (MOBIC) 15 MG tablet Take 15 mg by mouth daily. 01/21/21   [provider]  Multiple Vitamins-Minerals (MULTIVITAMIN WITH MINERALS) tablet Take 1 tablet by mouth daily. Woman    [provider]  naphazoline-pheniramine (VISINE) 0.025-0.3 % ophthalmic solution Place 1 drop into both eyes 4 (four) times daily as needed for eye irritation.    [provider]  naproxen (NAPROSYN) 500 MG tablet Take 1 tablet (500 mg total) by mouth 2 (two) times  daily. 06/20/21   Mickie Hillier, PA-C  OVER THE COUNTER MEDICATION Take by mouth daily. Vitamin b 12 sl liquid    [provider]  rosuvastatin (CRESTOR) 5 MG tablet Take 1 tablet (5 mg total) by mouth daily. 08/12/20 11/10/20  Ledora Bottcher, PA      Allergies    Penicillins and Coconut (cocos nucifera)    Review of Systems   Review of Systems  Physical Exam Updated Vital Signs BP (!) 147/89 (BP Location: Left Arm)   Pulse 83   Temp 98.2 F (36.8 C) (Oral)   Resp 18   LMP 08/30/2021 (Approximate)   SpO2 99%  Physical Exam Vitals and nursing note reviewed. Exam conducted with a chaperone present.  Constitutional:      General: She is not in acute distress.    Appearance: Normal appearance.  HENT:     Head: Normocephalic and atraumatic.  Eyes:     General: No scleral icterus.    Extraocular Movements: Extraocular movements intact.     Pupils: Pupils are equal, round, and reactive to light.  Cardiovascular:     Pulses: Normal pulses.  Musculoskeletal:        General: Tenderness present.     Comments: Tolerates passive ROM to right knee.  Normal ROM of the hip and ankle.  Decreased ROM, pain worse medially.  No crepitus  Skin:    Capillary Refill: Capillary refill takes less than 2 seconds.  Coloration: Skin is not jaundiced.     Findings: No erythema or rash.  Neurological:     Mental Status: She is alert. Mental status is at baseline.     Coordination: Coordination normal.     ED Results / Procedures / Treatments   Labs (all labs ordered are listed, but only abnormal results are displayed) Labs Reviewed - No data to display  EKG None  Radiology DG Knee Complete 4 Views Right  Result Date: 10/16/2021 CLINICAL DATA:  Pain and swelling x2 weeks EXAM: RIGHT KNEE - COMPLETE 4+ VIEW COMPARISON:  06/20/2021 FINDINGS: No recent fracture or dislocation is seen. Degenerative changes are noted with bony spurs in medial, lateral and patellofemoral  compartments, more so in the medial compartment. There is soft tissue fullness in suprapatellar bursa suggesting small effusion. IMPRESSION: No recent fracture or dislocation is seen in right knee. Degenerative changes are noted, more so in the medial compartment. Small effusion is seen in suprapatellar bursa. Electronically Signed   By: Elmer Picker M.D.   On: 10/16/2021 13:27    Procedures Procedures    Medications Ordered in ED Medications  ketorolac (TORADOL) 30 MG/ML injection 30 mg (has no administration in time range)    ED Course/ Medical Decision Making/ A&P                           Medical Decision Making Risk Prescription drug management.   Patient presents due to right knee pain.  Differential includes not limited to dislocation, fracture, septic arthritis, gout, gonococcal arthritis.  On exam patient is neurovascular intact with brisk cap refill and DP PT 2+.  The skin is warm but not erythematous.  There is no appreciable swelling, tolerates passive ROM making septic joint very unlikely.  I ordered and viewed the plain film.  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  I ordered Toradol, knee immobilization and will send patient home with stronger anti-inflammatory and add Tylenol to her regimen.  Patient instructed to follow-up with Dr. Mable Fill.  Return precautions discussed, discharged in stable condition.        Final Clinical Impression(s) / ED Diagnoses Final diagnoses:  None    Rx / DC Orders ED Discharge Orders     None         Sherrill Raring, [place] 10/16/21 1412    Tegeler, Gwenyth Allegra, MD 10/16/21 1517

## 2021-10-16 NOTE — Discharge Instructions (Addendum)
Take Tylenol as well as ibuprofen 800 mg 3 times daily.  Wear the immobilizer as needed for support.  Continue to ice, follow-up with your orthopedic doctor next week.  If you start having fevers, inability to move the knee, significant swelling or new or concerning symptoms return to ED.

## 2022-01-20 ENCOUNTER — Emergency Department (HOSPITAL_BASED_OUTPATIENT_CLINIC_OR_DEPARTMENT_OTHER): Payer: Medicaid Other

## 2022-01-20 ENCOUNTER — Emergency Department (HOSPITAL_BASED_OUTPATIENT_CLINIC_OR_DEPARTMENT_OTHER)
Admission: EM | Admit: 2022-01-20 | Discharge: 2022-01-20 | Disposition: A | Discharge status: Home / Self Care | Payer: Medicaid Other | Attending: Emergency Medicine | Admitting: Emergency Medicine

## 2022-01-20 ENCOUNTER — Encounter (HOSPITAL_BASED_OUTPATIENT_CLINIC_OR_DEPARTMENT_OTHER): Payer: Self-pay

## 2022-01-20 ENCOUNTER — Other Ambulatory Visit: Payer: Self-pay

## 2022-01-20 DIAGNOSIS — N939 Abnormal uterine and vaginal bleeding, unspecified: Secondary | ICD-10-CM | POA: Insufficient documentation

## 2022-01-20 DIAGNOSIS — D259 Leiomyoma of uterus, unspecified: Secondary | ICD-10-CM | POA: Insufficient documentation

## 2022-01-20 DIAGNOSIS — D649 Anemia, unspecified: Secondary | ICD-10-CM | POA: Diagnosis not present

## 2022-01-20 DIAGNOSIS — R1032 Left lower quadrant pain: Secondary | ICD-10-CM | POA: Diagnosis present

## 2022-01-20 DIAGNOSIS — A599 Trichomoniasis, unspecified: Secondary | ICD-10-CM | POA: Diagnosis not present

## 2022-01-20 LAB — URINALYSIS, ROUTINE W REFLEX MICROSCOPIC
Bilirubin Urine: NEGATIVE
Glucose, UA: NEGATIVE mg/dL
Ketones, ur: NEGATIVE mg/dL
Leukocytes,Ua: NEGATIVE
Nitrite: NEGATIVE
Protein, ur: NEGATIVE mg/dL
Specific Gravity, Urine: 1.021 (ref 1.005–1.030)
pH: 5.5 (ref 5.0–8.0)

## 2022-01-20 LAB — COMPREHENSIVE METABOLIC PANEL
ALT: 10 U/L (ref 0–44)
AST: 15 U/L (ref 15–41)
Albumin: 4 g/dL (ref 3.5–5.0)
Alkaline Phosphatase: 53 U/L (ref 38–126)
Anion gap: 7 (ref 5–15)
BUN: 13 mg/dL (ref 6–20)
CO2: 26 mmol/L (ref 22–32)
Calcium: 9.2 mg/dL (ref 8.9–10.3)
Chloride: 103 mmol/L (ref 98–111)
Creatinine, Ser: 0.8 mg/dL (ref 0.44–1.00)
GFR, Estimated: >60 mL/min (ref >60)
Glucose, Bld: 96 mg/dL (ref 70–99)
Potassium: 3.5 mmol/L (ref 3.5–5.1)
Sodium: 136 mmol/L (ref 135–145)
Total Bilirubin: 0.5 mg/dL (ref 0.3–1.2)
Total Protein: 8.1 g/dL (ref 6.5–8.1)

## 2022-01-20 LAB — CBC
HCT: 38.8 % (ref 36.0–46.0)
Hemoglobin: 11.8 g/dL — ABNORMAL LOW (ref 12.0–15.0)
MCH: 20.7 pg — ABNORMAL LOW (ref 26.0–34.0)
MCHC: 30.4 g/dL (ref 30.0–36.0)
MCV: 68.1 fL — ABNORMAL LOW (ref 80.0–100.0)
Platelets: 250 10*3/uL (ref 150–400)
RBC: 5.7 MIL/uL — ABNORMAL HIGH (ref 3.87–5.11)
RDW: 16.6 % — ABNORMAL HIGH (ref 11.5–15.5)
WBC: 5.3 10*3/uL (ref 4.0–10.5)
nRBC: 0 % (ref 0.0–0.2)

## 2022-01-20 LAB — PREGNANCY, URINE: Preg Test, Ur: NEGATIVE

## 2022-01-20 LAB — WET PREP, GENITAL
Clue Cells Wet Prep HPF POC: NONE SEEN
Sperm: NONE SEEN
WBC, Wet Prep HPF POC: <10 (ref <10)
Yeast Wet Prep HPF POC: NONE SEEN

## 2022-01-20 LAB — LIPASE, BLOOD: Lipase: <10 U/L — ABNORMAL LOW (ref 11–51)

## 2022-01-20 MED ORDER — METRONIDAZOLE 500 MG PO TABS
2000.0000 mg | ORAL_TABLET | Freq: Once | ORAL | Status: AC
  Administered 2022-01-20: 2000 mg via ORAL
  Filled 2022-01-20: qty 4

## 2022-01-20 MED ORDER — ONDANSETRON 4 MG PO TBDP
4.0000 mg | ORAL_TABLET | Freq: Once | ORAL | Status: AC
  Administered 2022-01-20: 4 mg via ORAL
  Filled 2022-01-20: qty 1

## 2022-01-20 NOTE — ED Triage Notes (Signed)
Patient here POV from Home.  Endorses Vaginal Bleeding for approximately 10 Days. Typically her Menstrual Cycle lasts 6 Days but this Bleeding is new and separate from her Typical Menstruation. Endorses Left Lower ABD Pain and Left Flank Pain for 2-3 Days.   NAD Noted during Triage. A&Ox4. GCS 15. Ambulatory.

## 2022-01-20 NOTE — ED Provider Notes (Signed)
[facility] Provider [facility]   CSN: [id number] Arrival date & time: 01/20/22  1211     History  Chief Complaint  Patient presents with   Vaginal Bleeding    Nancy Davis is a 46 y.o. female.  Patient presents to the emergency department for evaluation of abdominal pain and vomiting.  She states that she has a history of anemia.  She states that she has had 10 days of bleeding currently, with passage of small clots at times when bleeding is heavier.  She states increased fatigue and lightheadedness.  She actually had to go home from work early yesterday because of her symptoms.  Pain is in the left lower abdomen and left mid abdomen.  No dysuria, increased frequency or urgency.  No constipation or diarrhea.  Patient has had some nausea without vomiting.  No fevers.  In addition to a prolonged duration of bleeding, she has had more frequent episodes of bleeding recently.  States that prior to current episode of bleeding, she only had 1 week of no bleeding and states that she has had 3 periods this month. Reports she is sexually active with one partner, but believes partner has had multiple partners.        Home Medications Prior to Admission medications   Medication Sig Start Date End Date Taking? Authorizing Provider  acetaminophen (TYLENOL) 500 MG tablet Take 500 mg by mouth every 6 (six) hours as needed for mild pain or moderate pain.    [provider]  amLODipine (NORVASC) 10 MG tablet Take 1 tablet (10 mg total) by mouth daily. 10/23/20   Duke, Tami Lin, PA  diclofenac Sodium (VOLTAREN) 1 % GEL Apply 2 g topically 4 (four) times daily. 06/20/21   Mickie Hillier, PA-C  ibuprofen (ADVIL) 800 MG tablet Take 1 tablet (800 mg total) by mouth 3 (three) times daily. 10/16/21   Sherrill Raring, PA-C  lisinopril-hydrochlorothiazide (ZESTORETIC) 10-12.5 MG tablet Take 1 tablet by mouth daily. 07/14/20 02/23/21  Cherene Altes, MD  Multiple  Vitamins-Minerals (MULTIVITAMIN WITH MINERALS) tablet Take 1 tablet by mouth daily. Woman    [provider]  naphazoline-pheniramine (VISINE) 0.025-0.3 % ophthalmic solution Place 1 drop into both eyes 4 (four) times daily as needed for eye irritation.    [provider]  OVER THE COUNTER MEDICATION Take by mouth daily. Vitamin b 12 sl liquid    [provider]  rosuvastatin (CRESTOR) 5 MG tablet Take 1 tablet (5 mg total) by mouth daily. 08/12/20 11/10/20  Ledora Bottcher, PA      Allergies    Penicillins and Coconut (cocos nucifera)    Review of Systems   Review of Systems  Physical Exam Updated Vital Signs BP (!) 156/83 (BP Location: Left Arm)   Pulse 78   Temp 98.2 F (36.8 C)   Resp 16   Ht '5\' 9"'$  (1.753 m)   Wt 83.2 kg   SpO2 98%   BMI 27.09 kg/m   Physical Exam Vitals and nursing note reviewed. Exam conducted with a chaperone present ([place]).  Constitutional:      General: She is not in acute distress.    Appearance: She is well-developed.  HENT:     Head: Normocephalic and atraumatic.     Right Ear: External ear normal.     Left Ear: External ear normal.     Nose: Nose normal.  Eyes:     Conjunctiva/sclera: Conjunctivae normal.  Cardiovascular:  Rate and Rhythm: Normal rate and regular rhythm.     Heart sounds: No murmur heard. Pulmonary:     Effort: No respiratory distress.     Breath sounds: No wheezing, rhonchi or rales.  Abdominal:     Palpations: Abdomen is soft.     Tenderness: There is abdominal tenderness. There is no guarding or rebound.     Comments: Suprapubic and left lower quadrant tenderness to palpation, no rebound or guarding  Genitourinary:    Exam position: Lithotomy position.     Labia:        Right: No rash.        Left: No rash.      Vagina: Bleeding (dark red blood, mild, no clots or pooling) present.     Cervix: No cervical motion tenderness.     Uterus: Tender.      Adnexa:        Right: No  tenderness or fullness.         Left: Tenderness present. No fullness.    Musculoskeletal:     Cervical back: Normal range of motion and neck supple.     Right lower leg: No edema.     Left lower leg: No edema.  Skin:    General: Skin is warm and dry.     Findings: No rash.  Neurological:     General: No focal deficit present.     Mental Status: She is alert. Mental status is at baseline.     Motor: No weakness.  Psychiatric:        Mood and Affect: Mood normal.     ED Results / Procedures / Treatments   Labs (all labs ordered are listed, but only abnormal results are displayed) Labs Reviewed  WET PREP, GENITAL - Abnormal; Notable for the following components:      Result Value   Trich, Wet Prep PRESENT (*)    All other components within normal limits  LIPASE, BLOOD - Abnormal; Notable for the following components:   Lipase <10 (*)    All other components within normal limits  CBC - Abnormal; Notable for the following components:   RBC 5.70 (*)    Hemoglobin 11.8 (*)    MCV 68.1 (*)    MCH 20.7 (*)    RDW 16.6 (*)    All other components within normal limits  URINALYSIS, ROUTINE W REFLEX MICROSCOPIC - Abnormal; Notable for the following components:   Hgb urine dipstick MODERATE (*)    Bacteria, UA RARE (*)    All other components within normal limits  COMPREHENSIVE METABOLIC PANEL  PREGNANCY, URINE  GC/CHLAMYDIA PROBE AMP ([place]) NOT AT [facility]    EKG None  Radiology US PELVIC COMPLETE W TRANSVAGINAL AND TORSION R/O  Result Date: 01/20/2022 CLINICAL DATA:  Vaginal bleeding for 10 days. Lower abdominal pain for 2-3 days. EXAM: TRANSABDOMINAL AND TRANSVAGINAL ULTRASOUND OF PELVIS DOPPLER ULTRASOUND OF OVARIES TECHNIQUE: Both transabdominal and transvaginal ultrasound examinations of the pelvis were performed. Transabdominal technique was performed for global imaging of the pelvis including uterus, ovaries, adnexal regions, and pelvic cul-de-sac. It was necessary to  proceed with endovaginal exam following the transabdominal exam to visualize the uterus, ovaries, and adnexa. Color and duplex Doppler ultrasound was utilized to evaluate blood flow to the ovaries. COMPARISON:  None Available. FINDINGS: Uterus Measurements: 9.7 x 4.7 x 5.3 cm = volume: 125 mL. uterine fibroids. Examples right-sided body at 1.8 cm. Left-sided body at 1.9 cm. Endometrium Thickness: Normal, 5  mm. Avascular heterogeneous material within the endocervix. Right ovary Measurements: 3.0 x 1.4 x 1.7 cm = volume: 3.6 mL. Normal appearance/no adnexal mass. Left ovary Measurements: 2.2 x 1.3 x 1.9 cm = volume: 2.8 mL. Normal appearance/no adnexal mass. Pulsed Doppler evaluation of both ovaries demonstrates normal low-resistance arterial and venous waveforms. Other findings No significant free fluid. IMPRESSION: 1. Uterine fibroids. 2. No endometrial thickening. Avascular heterogeneous material within the endocervix most like represents blood products. Correlate with urine pregnancy as abortion in progress could have this appearance. Electronically Signed   By: Abigail Miyamoto M.D.   On: 01/20/2022 14:39    Procedures Procedures    Medications Ordered in ED Medications  metroNIDAZOLE (FLAGYL) tablet 2,000 mg (has no administration in time range)  ondansetron (ZOFRAN-ODT) disintegrating tablet 4 mg (has no administration in time range)    ED Course/ Medical Decision Making/ A&P    Patient seen and examined. History obtained directly from patient. Work-up including labs, imaging, EKG ordered in triage, if performed, were reviewed.    Labs/EKG: Independently reviewed and interpreted.  This included: CBC with normal white blood cell count, hemoglobin 11.8; CMP unremarkable; lipase less than 10; UA no compelling signs of infection.  Wet prep, GC/chlamydia pending.  Imaging: Ultrasound ordered  Medications/Fluids: Ordered: None  Most recent vital signs reviewed and are as follows: BP (!) 156/83  (BP Location: Left Arm)   Pulse 78   Temp 98.2 F (36.8 C)   Resp 16   Ht '5\' 9"'$  (1.753 m)   Wt 83.2 kg   SpO2 98%   BMI 27.09 kg/m   Initial impression: Vaginal bleeding, pelvic pain  2:55 PM Reassessment performed. Patient appears stable.  Labs personally reviewed and interpreted including: Wet prep positive for trichomonas  Imaging results reviewed including: Pelvic ultrasound demonstrating uterine fibroids, normal ovaries  Reviewed pertinent lab work and imaging with patient at bedside. Questions answered.   Most current vital signs reviewed and are as follows: BP 132/77   Pulse 71   Temp 98.2 F (36.8 C)   Resp 16   Ht '5\' 9"'$  (1.753 m)   Wt 83.2 kg   SpO2 98%   BMI 27.09 kg/m   Plan: We will treat with metronidazole here, d/c home.   Prescriptions written for: Naproxen  Other home care instructions discussed: Naproxen for pain, continue to monitor bleeding.    Patient counseled on safe sexual practices, encouraged them to avoid sexual contact for 7 days and to inform sexual partners so that they can get tested and treated as well. Patient verbalizes understanding and agrees with plan.    Asked to follow-up with GC/chlamydia testing in MyChart.  She will be notified if she test positive with further instructions.  Encouraged routine check at OB/GYN as planned in December.  ED return instructions discussed: Worsening bleeding, severe pain, persistent vomiting.  Follow-up instructions discussed: Patient encouraged to follow-up with their OB/GYN as planned/ASAP.                             Medical Decision Making Amount and/or Complexity of Data Reviewed Labs: ordered. Radiology: ordered.  Risk Prescription drug management.   Vaginal bleeding, pelvic pain: Patient is not pregnant.  She has had more frequent and longer duration episodes of bleeding recently with irregular periods.  Ultrasound today shows uterine fibroids.  Mild anemia, at baseline.  No  indication for blood transfusion.  No fevers.  Minimal tenderness  on exam and low clinical suspicion for PID.  No significant preceding discharge or significant purulent discharge noted on exam today.  Will treat for trichomoniasis.  No sign of tubo-ovarian abscess or ovarian torsion.  Trichomoniasis: Treated with oral meds here in emergency department.  Gonorrhea/chlamydia testing is pending.        Final Clinical Impression(s) / ED Diagnoses Final diagnoses:  Uterine leiomyoma, unspecified location  Vaginal bleeding  Trichomoniasis    Rx / DC Orders ED Discharge Orders     None         Carlisle Cater, PA-C 01/20/22 1501    Blanchie Dessert, MD 01/23/22 1146

## 2022-01-20 NOTE — Discharge Instructions (Signed)
Please read and follow all provided instructions.  Your diagnoses today include:  1. Uterine leiomyoma, unspecified location   2. Vaginal bleeding   3. Trichomoniasis     Tests performed today include: Complete blood cell count: Mild anemia Basic metabolic panel: No concerning findings Urinalysis (urine test): No compelling signs of infection Pregnancy test (urine or blood, in women only): Was negative Test for gonorrhea and chlamydia.  Wet prep: was positive for trichomonas Vital signs. See below for your results today.   Medications:  Naproxen - anti-inflammatory pain medication Do not exceed '500mg'$  naproxen every 12 hours, take with food  You have been prescribed an anti-inflammatory medication or NSAID. Take with food. Take smallest effective dose for the shortest duration needed for your pain. Stop taking if you experience stomach pain or vomiting.   You were also given metronidazole (pills) in the Emergency Department. that treat for trichomonas.   Home care instructions:  Read educational materials contained in this packet and follow any instructions provided.   You should tell your partners about your infection and avoid having sex for one week to allow time for the medicine to work.  Sexually transmitted disease testing also available at:  [facility] [address] [place], phone [phone] or [phone]   Monday - Friday, call for an appointment  Return instructions:  Please return to the Emergency Department if you experience worsening symptoms.  Please return if you have any other emergent concerns.  Additional Information:  Your vital signs today were: BP 132/77   Pulse 71   Temp 98.2 F (36.8 C)   Resp 16   Ht '5\' 9"'$  (1.753 m)   Wt 83.2 kg   SpO2 98%   BMI 27.09 kg/m  If your blood pressure (BP) was elevated above 135/85 this visit, please have this repeated by your doctor within one  month. --------------

## 2022-01-21 LAB — GC/CHLAMYDIA PROBE AMP (~~LOC~~) NOT AT ARMC
Chlamydia: NEGATIVE
Comment: NEGATIVE
Comment: NORMAL
Neisseria Gonorrhea: NEGATIVE

## 2022-01-27 ENCOUNTER — Ambulatory Visit: Payer: Medicaid Other | Admitting: Radiology

## 2022-01-27 ENCOUNTER — Encounter: Payer: Self-pay | Admitting: Radiology

## 2022-01-27 VITALS — BP 112/86 | Ht 68.0 in | Wt 198.0 lb

## 2022-01-27 DIAGNOSIS — D219 Benign neoplasm of connective and other soft tissue, unspecified: Secondary | ICD-10-CM

## 2022-01-27 DIAGNOSIS — Z23 Encounter for immunization: Secondary | ICD-10-CM | POA: Diagnosis not present

## 2022-01-27 MED ORDER — MYFEMBREE 40-1-0.5 MG PO TABS: 1.0000 | ORAL_TABLET | Freq: Every day | ORAL | 2 refills | Status: DC

## 2022-01-27 NOTE — Progress Notes (Signed)
[facility] 1976-03-01 [id number]   History:  46 y.o. G1P1 presents as a referral from ED for menorrhagia with fibroids found on u/s 01/20/22.   FINDINGS: Uterus   Measurements: 9.7 x 4.7 x 5.3 cm = volume: 125 mL. uterine fibroids. Examples right-sided body at 1.8 cm. Left-sided body at 1.9 cm.   Endometrium   Thickness: Normal, 5 mm. Avascular heterogeneous material within the endocervix.   Right ovary   Measurements: 3.0 x 1.4 x 1.7 cm = volume: 3.6 mL. Normal appearance/no adnexal mass.   Left ovary   Measurements: 2.2 x 1.3 x 1.9 cm = volume: 2.8 mL. Normal appearance/no adnexal mass.   Pulsed Doppler evaluation of both ovaries demonstrates normal low-resistance arterial and venous waveforms.   Other findings   No significant free fluid.   IMPRESSION: 1. Uterine fibroids. 2. No endometrial thickening. Avascular heterogeneous material within the endocervix most like represents blood products. Correlate with urine pregnancy as abortion in progress could have this appearance.      Gynecologic History Patient's last menstrual period was 12/29/2021 (approximate). Period Pattern: (!) Irregular Menstrual Flow: Heavy Menstrual Control: Tampon, Maxi pad Dysmenorrhea: (!) Severe Dysmenorrhea Symptoms: Cramping Contraception/Family planning: abstinence Sexually active: no Last Pap: 2003. Results were: normal   Obstetric History OB History  Gravida Para Term Preterm AB Living  '1 1       1  '$ SAB IAB Ectopic Multiple Live Births               # Outcome Date GA Lbr Len/2nd Weight Sex Delivery Anes PTL Lv  1 Para              The following portions of the patient's history were reviewed and updated as appropriate: allergies, current medications, past family history, past medical history, past social history, past surgical history, and problem list.  Review of Systems Pertinent items noted in HPI and remainder of comprehensive ROS otherwise negative.    Past medical history, past surgical history, family history and social history were all reviewed and documented in the EPIC chart.   Exam:  Vitals:   01/27/22 1044  BP: 112/86  Weight: 198 lb (89.8 kg)  Height: '5\' 8"'$  (1.727 m)   Body mass index is 30.11 kg/m.  General appearance:  Normal Respiratory  Auscultation:  Clear without wheezing or rhonchi Cardiovascular  Auscultation:  Regular rate, without rubs, murmurs or gallops  Edema/varicosities:  Not grossly evident Abdominal  Soft,nontender, without masses, guarding or rebound.  Liver/spleen:  No organomegaly noted  Hernia:  None appreciated Genitourinary   Inguinal/mons:  Normal without inguinal adenopathy  External genitalia:  Normal appearing vulva with no masses, tenderness, or lesions  BUS/Urethra/Skene's glands:  Normal without masses or exudate  Vagina:  Normal appearing with normal color and discharge, no lesions  Cervix:  Normal appearing without discharge or lesions  Uterus:  enlarged to 10 weeks, fibroids  Adnexa/parametria:     Rt: Normal in size, without masses or tenderness.   Lt: Normal in size, without masses or tenderness.  Anus and perineum: Normal   Patient informed chaperone available to be present for breast and pelvic exam. Patient has requested no chaperone to be present. Patient has been advised what will be completed during breast and pelvic exam.   Assessment/Plan:   1. Fibroids Discussed all options for treatment, would like to try Myfembree, rx sent Reviewed side effects - Relugolix-Estradiol-Norethind (MYFEMBREE) 40-1-0.5 MG TABS; Take 1 tablet by mouth daily.  Dispense:  28 tablet; Refill: 2  2. Need for immunization against influenza  - Flu Vaccine QUAD 9moIM (Fluarix, Fluzone & Alfiuria Quad PF)   Schedule AEX and f/u in 3 months  Jacquelin Krajewski B WHNP-BC 11:21 AM 01/27/2022

## 2022-01-29 ENCOUNTER — Telehealth: Payer: Self-pay

## 2022-01-29 NOTE — Telephone Encounter (Signed)
Pt calling to report going by pharmacy today to pick up Rc for [facility] and pharmacy stating they did not receive Rx.   I spoke with pharmacy and tech stated that they had power outage the other day so there were Rxs that didn't come through. I asked to leave a verbal, they sent me to VM due to pharmacist being tied up, I left verbal on machine and called pt to notify her.  No answer, LVMTCB

## 2022-02-24 ENCOUNTER — Ambulatory Visit: Payer: Medicaid Other | Admitting: Radiology

## 2022-03-01 ENCOUNTER — Ambulatory Visit: Payer: Medicaid Other | Admitting: Radiology

## 2022-03-01 ENCOUNTER — Encounter: Payer: Self-pay | Admitting: Radiology

## 2022-03-01 DIAGNOSIS — D219 Benign neoplasm of connective and other soft tissue, unspecified: Secondary | ICD-10-CM

## 2022-03-01 MED ORDER — MYFEMBREE 40-1-0.5 MG PO TABS: 1.0000 | ORAL_TABLET | Freq: Every day | ORAL | 2 refills | Status: DC

## 2022-03-01 NOTE — Progress Notes (Signed)
Nancy Davis 1976-01-30 [id number]   History:  46 y.o. here for follow up re: fibroids. Was started on myfembree and only took for 1 week because it did not stop her bleeding right away. She complains of back pain, heavy bleeding (none today) and cramping with cycles. She is here to talk about hysterectomy.  FINDINGS: Uterus   Measurements: 9.7 x 4.7 x 5.3 cm = volume: 125 mL. uterine fibroids. Examples right-sided body at 1.8 cm. Left-sided body at 1.9 cm.   Endometrium   Thickness: Normal, 5 mm. Avascular heterogeneous material within the endocervix.   Right ovary   Measurements: 3.0 x 1.4 x 1.7 cm = volume: 3.6 mL. Normal appearance/no adnexal mass.   Left ovary   Measurements: 2.2 x 1.3 x 1.9 cm = volume: 2.8 mL. Normal appearance/no adnexal mass.   Pulsed Doppler evaluation of both ovaries demonstrates normal low-resistance arterial and venous waveforms.   Other findings   No significant free fluid.   IMPRESSION: 1. Uterine fibroids. 2. No endometrial thickening. Avascular heterogeneous material within the endocervix most like represents blood products. Correlate with urine pregnancy as abortion in progress could have this appearance.      Gynecologic History No LMP recorded.   Contraception/Family planning: abstinence Sexually active: no Last Pap: 2003. Results were: normal   Obstetric History OB History  Gravida Para Term Preterm AB Living  '1 1       1  '$ SAB IAB Ectopic Multiple Live Births               # Outcome Date GA Lbr Len/2nd Weight Sex Delivery Anes PTL Lv  1 Para              The following portions of the patient's history were reviewed and updated as appropriate: allergies, current medications, past family history, past medical history, past social history, past surgical history, and problem list.  Review of Systems Pertinent items noted in HPI and remainder of comprehensive ROS otherwise negative.   Past medical history, past  surgical history, family history and social history were all reviewed and documented in the EPIC chart.   Exam:  Vitals:   03/01/22 1043  BP: 134/80   There is no height or weight on file to calculate BMI.  General appearance:  Normal Respiratory  Auscultation:  Clear without wheezing or rhonchi Cardiovascular  Auscultation:  Regular rate, without rubs, murmurs or gallops  Edema/varicosities:  Not grossly evident Abdominal  Soft,nontender, without masses, guarding or rebound.  Liver/spleen:  No organomegaly noted  Hernia:  None appreciated Genitourinary   Inguinal/mons:  Normal without inguinal adenopathy  External genitalia:  Normal appearing vulva with no masses, tenderness, or lesions  BUS/Urethra/Skene's glands:  Normal without masses or exudate  Vagina:  Normal appearing with normal color and discharge, no lesions  Cervix:  Normal appearing without discharge or lesions  Uterus:  enlarged to 10 weeks, fibroids  Adnexa/parametria:     Rt: Normal in size, without masses or tenderness.   Lt: Normal in size, without masses or tenderness.  Anus and perineum: Normal   Patient informed chaperone available to be present for breast and pelvic exam. Patient has requested no chaperone to be present. Patient has been advised what will be completed during breast and pelvic exam.   Assessment/Plan:   1. Fibroids Open to trying myfembree while she waits for surgical consult Reviewed side effects - Relugolix-Estradiol-Norethind (MYFEMBREE) 40-1-0.5 MG TABS; Take 1 tablet by mouth daily.  Dispense:  28 tablet; Refill: 2   Shaelee Forni B WHNP-BC 10:59 AM 03/01/2022

## 2022-03-02 ENCOUNTER — Encounter (HOSPITAL_BASED_OUTPATIENT_CLINIC_OR_DEPARTMENT_OTHER): Payer: Self-pay

## 2022-03-02 ENCOUNTER — Ambulatory Visit (HOSPITAL_BASED_OUTPATIENT_CLINIC_OR_DEPARTMENT_OTHER): Payer: Medicaid Other | Admitting: Family Medicine

## 2022-03-04 NOTE — Telephone Encounter (Signed)
No returned call received. Safe to assume pt was able to pick up Rx and close encounter? Please advise.

## 2022-03-09 NOTE — Telephone Encounter (Signed)
Ok to close

## 2022-03-12 ENCOUNTER — Emergency Department (HOSPITAL_BASED_OUTPATIENT_CLINIC_OR_DEPARTMENT_OTHER)
Admission: EM | Admit: 2022-03-12 | Discharge: 2022-03-12 | Disposition: A | Discharge status: Home / Self Care | Payer: Medicaid Other | Attending: Emergency Medicine | Admitting: Emergency Medicine

## 2022-03-12 ENCOUNTER — Encounter (HOSPITAL_BASED_OUTPATIENT_CLINIC_OR_DEPARTMENT_OTHER): Payer: Self-pay | Admitting: Emergency Medicine

## 2022-03-12 ENCOUNTER — Other Ambulatory Visit: Payer: Self-pay

## 2022-03-12 DIAGNOSIS — M25561 Pain in right knee: Secondary | ICD-10-CM | POA: Diagnosis not present

## 2022-03-12 DIAGNOSIS — Z79899 Other long term (current) drug therapy: Secondary | ICD-10-CM | POA: Insufficient documentation

## 2022-03-12 DIAGNOSIS — M25462 Effusion, left knee: Secondary | ICD-10-CM | POA: Insufficient documentation

## 2022-03-12 DIAGNOSIS — M25562 Pain in left knee: Secondary | ICD-10-CM | POA: Diagnosis present

## 2022-03-12 MED ORDER — KETOROLAC TROMETHAMINE 60 MG/2ML IM SOLN
60.0000 mg | Freq: Once | INTRAMUSCULAR | Status: AC
  Administered 2022-03-12: 60 mg via INTRAMUSCULAR
  Filled 2022-03-12: qty 2

## 2022-03-12 MED ORDER — PREDNISONE 50 MG PO TABS
60.0000 mg | ORAL_TABLET | Freq: Once | ORAL | Status: AC
  Administered 2022-03-12: 60 mg via ORAL
  Filled 2022-03-12: qty 1

## 2022-03-12 MED ORDER — METHYLPREDNISOLONE 4 MG PO TBPK: ORAL_TABLET | ORAL | 0 refills | Status: DC

## 2022-03-12 NOTE — ED Notes (Signed)
Discharge instructions, follow up care, pain management, and prescription reviewed and explained, pt verbalized understanding and had no further questions on d/c. Ice pack provided. Pt caox4 and ambulatory on d/c.

## 2022-03-12 NOTE — ED Provider Notes (Signed)
[facility] EMERGENCY DEPT Provider Note   CSN: [id number] Arrival date & time: 03/12/22  1118     History  Chief Complaint  Patient presents with   Knee Pain    Nancy Davis is a 46 y.o. female with history of osteoarthritis presenting to ED with left knee pain.  She reports gradual and atraumatic onset of left knee pain about 2 weeks ago.  She says it has reached the point where she is having difficulty bearing full weight due to pain in her left knee and she is limping otherwise.  She denies any falls or injuries.  She has been seen in the past twice is here in the ER for pain in her right knee, and has a history of a right medial meniscus tear in that right knee, and has been seen by orthopedics Dr Mable Fill as an outpatient for that.  But she had not been dealing with any problems with her left knee until this started.  She denies history of DVT.  She had a negative vascular ultrasound a few months ago in the ED for her other knee pain.  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  She reports no fevers chills or history of gout or gout symptoms to me  HPI     Home Medications Prior to Admission medications   Medication Sig Start Date End Date Taking? Authorizing Provider  methylPREDNISolone (MEDROL DOSEPAK) 4 MG TBPK tablet Use as directed on package 03/13/22  Yes De Jaworski, Carola Rhine, MD  acetaminophen (TYLENOL) 500 MG tablet Take 500 mg by mouth every 6 (six) hours as needed for mild pain or moderate pain.    [provider]  amLODipine (NORVASC) 10 MG tablet Take 1 tablet (10 mg total) by mouth daily. 10/23/20   Duke, Tami Lin, PA  CELEBREX 200 MG capsule Take 200 mg by mouth 2 (two) times daily. 12/31/21   [provider]  diclofenac Sodium (VOLTAREN) 1 % GEL Apply 2 g topically 4 (four) times daily. 06/20/21   Mickie Hillier,  PA-C  ibuprofen (ADVIL) 800 MG tablet Take 1 tablet (800 mg total) by mouth 3 (three) times daily. 10/16/21   Sherrill Raring, PA-C  lisinopril-hydrochlorothiazide (ZESTORETIC) 10-12.5 MG tablet Take 1 tablet by mouth daily. 07/14/20 03/01/22  Cherene Altes, MD  Multiple Vitamins-Minerals (MULTIVITAMIN WITH MINERALS) tablet Take 1 tablet by mouth daily. Woman    [provider]  naphazoline-pheniramine (VISINE) 0.025-0.3 % ophthalmic solution Place 1 drop into both eyes 4 (four) times daily as needed for eye irritation.    [provider]  OVER THE COUNTER MEDICATION Take by mouth daily. Vitamin b 12 sl liquid    [provider]  Relugolix-Estradiol-Norethind (MYFEMBREE) 40-1-0.5 MG TABS Take 1 tablet by mouth daily. 03/01/22   Chrzanowski, Jami B, NP  rosuvastatin (CRESTOR) 5 MG tablet Take 1 tablet (5 mg total) by mouth daily. 08/12/20 01/27/22  Duke, Tami Lin, PA      Allergies    Penicillins and Coconut (cocos nucifera)    Review of Systems   Review of Systems  Physical Exam Updated Vital Signs BP (!) 168/87 (BP Location: Right Arm)   Pulse 85   Temp 98.3 F (36.8 C) (Oral)   Resp 18   Ht '5\' 8"'$  (1.727 m)   Wt 86.6 kg   LMP 02/15/2022 (Approximate)  SpO2 99%   BMI 29.04 kg/m  Physical Exam Constitutional:      General: She is not in acute distress. HENT:     Head: Normocephalic and atraumatic.  Eyes:     Conjunctiva/sclera: Conjunctivae normal.     Pupils: Pupils are equal, round, and reactive to light.  Cardiovascular:     Rate and Rhythm: Normal rate and regular rhythm.  Pulmonary:     Effort: Pulmonary effort is normal. No respiratory distress.  Abdominal:     General: There is no distension.     Tenderness: There is no abdominal tenderness.  Musculoskeletal:     Comments: Mild to moderate left peripatellar effusion, which is not warm.  The patient is able to slowly bland and fully flex the left knee, with some pain with flexion.   Negative anterior drawer sign  Skin:    General: Skin is warm and dry.  Neurological:     General: No focal deficit present.     Mental Status: She is alert. Mental status is at baseline.  Psychiatric:        Mood and Affect: Mood normal.        Behavior: Behavior normal.     ED Results / Procedures / Treatments   Labs (all labs ordered are listed, but only abnormal results are displayed) Labs Reviewed - No data to display  EKG None  Radiology No results found.  Procedures Procedures    Medications Ordered in ED Medications  ketorolac (TORADOL) injection 60 mg (has no administration in time range)  predniSONE (DELTASONE) tablet 60 mg (has no administration in time range)    ED Course/ Medical Decision Making/ A&P                           Medical Decision Making Risk Prescription drug management.   Patient is here with atraumatic pain and swelling around the left knee.  Differential diagnosis include osteoarthritis versus meniscus or ligament injury versus gout or pseudogout versus other.  Clinically this is not consistent at all with septic joint, nor does she have risk factors.  Do not see an indication for emergent arthrocentesis at this time.  There is no indication for x-ray or CT imaging at this time as there is no mechanism to suggest that this is a fracture.  She does not have evidence of ACL or PCL tear.  I do however recommend that she apply the knee immobilizer that she ready has at home, consider using crutches for a few days in order to minimize amount of pressure she is placing on her left leg.  We will start her on some steroids here which may also help with the inflammation, she is already taking NSAIDs and ibuprofen regularly at home.  I recommended she call the orthopedic clinic to schedule follow-up appointment.  She verbalized understanding.  Okay for discharge        Final Clinical Impression(s) / ED Diagnoses Final diagnoses:  Acute  pain of right knee    Rx / DC Orders ED Discharge Orders          Ordered    methylPREDNISolone (MEDROL DOSEPAK) 4 MG TBPK tablet        03/12/22 1154              Wyvonnia Dusky, MD 03/12/22 1154

## 2022-04-22 ENCOUNTER — Ambulatory Visit (HOSPITAL_BASED_OUTPATIENT_CLINIC_OR_DEPARTMENT_OTHER): Payer: Medicaid Other | Admitting: Family Medicine

## 2022-04-29 ENCOUNTER — Ambulatory Visit: Payer: Medicaid Other | Admitting: Radiology

## 2022-05-11 ENCOUNTER — Ambulatory Visit: Payer: Medicaid Other | Admitting: Radiology

## 2022-05-27 ENCOUNTER — Emergency Department (HOSPITAL_COMMUNITY): Payer: Medicaid Other

## 2022-05-27 ENCOUNTER — Encounter (HOSPITAL_COMMUNITY): Payer: Self-pay | Admitting: Obstetrics and Gynecology

## 2022-05-27 ENCOUNTER — Inpatient Hospital Stay (HOSPITAL_COMMUNITY): Payer: Medicaid Other

## 2022-05-27 ENCOUNTER — Inpatient Hospital Stay (HOSPITAL_COMMUNITY)
Admission: EM | Admit: 2022-05-27 | Discharge: 2022-05-27 | DRG: 832 | Discharge status: Against Medical Advice | Payer: Medicaid Other | Attending: Obstetrics and Gynecology | Admitting: Obstetrics and Gynecology

## 2022-05-27 ENCOUNTER — Other Ambulatory Visit: Payer: Self-pay

## 2022-05-27 ENCOUNTER — Inpatient Hospital Stay (HOSPITAL_COMMUNITY)
Admission: AD | Admit: 2022-05-27 | Discharge: 2022-05-29 | DRG: 832 | Disposition: A | Discharge status: Home / Self Care | Payer: Medicaid Other | Attending: Obstetrics and Gynecology | Admitting: Obstetrics and Gynecology

## 2022-05-27 DIAGNOSIS — Z8349 Family history of other endocrine, nutritional and metabolic diseases: Secondary | ICD-10-CM

## 2022-05-27 DIAGNOSIS — O2300 Infections of kidney in pregnancy, unspecified trimester: Secondary | ICD-10-CM | POA: Diagnosis present

## 2022-05-27 DIAGNOSIS — Z8249 Family history of ischemic heart disease and other diseases of the circulatory system: Secondary | ICD-10-CM

## 2022-05-27 DIAGNOSIS — Z91018 Allergy to other foods: Secondary | ICD-10-CM

## 2022-05-27 DIAGNOSIS — O10911 Unspecified pre-existing hypertension complicating pregnancy, first trimester: Secondary | ICD-10-CM | POA: Diagnosis present

## 2022-05-27 DIAGNOSIS — D250 Submucous leiomyoma of uterus: Secondary | ICD-10-CM | POA: Diagnosis present

## 2022-05-27 DIAGNOSIS — O3411 Maternal care for benign tumor of corpus uteri, first trimester: Secondary | ICD-10-CM | POA: Diagnosis present

## 2022-05-27 DIAGNOSIS — Z8261 Family history of arthritis: Secondary | ICD-10-CM

## 2022-05-27 DIAGNOSIS — N390 Urinary tract infection, site not specified: Secondary | ICD-10-CM | POA: Diagnosis present

## 2022-05-27 DIAGNOSIS — O2301 Infections of kidney in pregnancy, first trimester: Principal | ICD-10-CM | POA: Diagnosis present

## 2022-05-27 DIAGNOSIS — O008 Other ectopic pregnancy without intrauterine pregnancy: Secondary | ICD-10-CM

## 2022-05-27 DIAGNOSIS — N12 Tubulo-interstitial nephritis, not specified as acute or chronic: Secondary | ICD-10-CM | POA: Diagnosis not present

## 2022-05-27 DIAGNOSIS — Z803 Family history of malignant neoplasm of breast: Secondary | ICD-10-CM

## 2022-05-27 DIAGNOSIS — Z3A00 Weeks of gestation of pregnancy not specified: Secondary | ICD-10-CM | POA: Diagnosis not present

## 2022-05-27 DIAGNOSIS — Z880 Allergy status to penicillin: Secondary | ICD-10-CM

## 2022-05-27 DIAGNOSIS — Z5329 Procedure and treatment not carried out because of patient's decision for other reasons: Secondary | ICD-10-CM | POA: Diagnosis present

## 2022-05-27 DIAGNOSIS — D252 Subserosal leiomyoma of uterus: Secondary | ICD-10-CM | POA: Diagnosis present

## 2022-05-27 DIAGNOSIS — Z3A01 Less than 8 weeks gestation of pregnancy: Secondary | ICD-10-CM

## 2022-05-27 LAB — CBC WITH DIFFERENTIAL/PLATELET
Abs Immature Granulocytes: 0 10*3/uL (ref 0.00–0.07)
Basophils Absolute: 0 10*3/uL (ref 0.0–0.1)
Basophils Relative: 0 %
Eosinophils Absolute: 0.2 10*3/uL (ref 0.0–0.5)
Eosinophils Relative: 2 %
HCT: 35.7 % — ABNORMAL LOW (ref 36.0–46.0)
Hemoglobin: 11.1 g/dL — ABNORMAL LOW (ref 12.0–15.0)
Lymphocytes Relative: 33 %
Lymphs Abs: 2.8 10*3/uL (ref 0.7–4.0)
MCH: 21 pg — ABNORMAL LOW (ref 26.0–34.0)
MCHC: 31.1 g/dL (ref 30.0–36.0)
MCV: 67.5 fL — ABNORMAL LOW (ref 80.0–100.0)
Monocytes Absolute: 0.5 10*3/uL (ref 0.1–1.0)
Monocytes Relative: 6 %
Neutro Abs: 5 10*3/uL (ref 1.7–7.7)
Neutrophils Relative %: 59 %
Platelets: 241 10*3/uL (ref 150–400)
RBC: 5.29 MIL/uL — ABNORMAL HIGH (ref 3.87–5.11)
RDW: 17.9 % — ABNORMAL HIGH (ref 11.5–15.5)
WBC: 8.5 10*3/uL (ref 4.0–10.5)
nRBC: 0 % (ref 0.0–0.2)
nRBC: 0 /100 WBC

## 2022-05-27 LAB — COMPREHENSIVE METABOLIC PANEL
ALT: 8 U/L (ref 0–44)
AST: 15 U/L (ref 15–41)
Albumin: 3.3 g/dL — ABNORMAL LOW (ref 3.5–5.0)
Alkaline Phosphatase: 54 U/L (ref 38–126)
Anion gap: 10 (ref 5–15)
BUN: 9 mg/dL (ref 6–20)
CO2: 24 mmol/L (ref 22–32)
Calcium: 8.6 mg/dL — ABNORMAL LOW (ref 8.9–10.3)
Chloride: 102 mmol/L (ref 98–111)
Creatinine, Ser: 0.73 mg/dL (ref 0.44–1.00)
GFR, Estimated: >60 mL/min (ref >60)
Glucose, Bld: 93 mg/dL (ref 70–99)
Potassium: 3.5 mmol/L (ref 3.5–5.1)
Sodium: 136 mmol/L (ref 135–145)
Total Bilirubin: 0.7 mg/dL (ref 0.3–1.2)
Total Protein: 6.9 g/dL (ref 6.5–8.1)

## 2022-05-27 LAB — URINALYSIS, ROUTINE W REFLEX MICROSCOPIC
Bilirubin Urine: NEGATIVE
Glucose, UA: NEGATIVE mg/dL
Ketones, ur: NEGATIVE mg/dL
Nitrite: POSITIVE — AB
Protein, ur: >300 mg/dL — AB
Specific Gravity, Urine: 1.025 (ref 1.005–1.030)
pH: 5 (ref 5.0–8.0)

## 2022-05-27 LAB — HCG, QUANTITATIVE, PREGNANCY: hCG, Beta Chain, Quant, S: 1550 m[IU]/mL — ABNORMAL HIGH (ref <5)

## 2022-05-27 LAB — I-STAT BETA HCG BLOOD, ED (MC, WL, AP ONLY): I-stat hCG, quantitative: 1403.3 m[IU]/mL — ABNORMAL HIGH (ref <5)

## 2022-05-27 LAB — URINALYSIS, MICROSCOPIC (REFLEX): RBC / HPF: >50 RBC/hpf (ref 0–5)

## 2022-05-27 LAB — LIPASE, BLOOD: Lipase: 29 U/L (ref 11–51)

## 2022-05-27 LAB — ABO/RH: ABO/RH(D): O POS

## 2022-05-27 MED ORDER — CALCIUM CARBONATE ANTACID 500 MG PO CHEW: 2.0000 | CHEWABLE_TABLET | ORAL | Status: DC | PRN

## 2022-05-27 MED ORDER — SODIUM CHLORIDE 0.9 % IV SOLN
2.0000 g | INTRAVENOUS | Status: DC
  Administered 2022-05-28 (×2): 2 g via INTRAVENOUS
  Filled 2022-05-27 (×3): qty 20

## 2022-05-27 MED ORDER — ACETAMINOPHEN 325 MG PO TABS: 650.0000 mg | ORAL_TABLET | ORAL | Status: DC | PRN

## 2022-05-27 MED ORDER — LACTATED RINGERS IV SOLN: 125.0000 mL/h | INTRAVENOUS | Status: AC

## 2022-05-27 MED ORDER — ONDANSETRON 4 MG PO TBDP: 8.0000 mg | ORAL_TABLET | Freq: Three times a day (TID) | ORAL | Status: DC | PRN

## 2022-05-27 MED ORDER — ACETAMINOPHEN 325 MG PO TABS
650.0000 mg | ORAL_TABLET | ORAL | Status: DC | PRN
  Administered 2022-05-28 (×2): 650 mg via ORAL
  Filled 2022-05-27 (×2): qty 2

## 2022-05-27 MED ORDER — PRENATAL MULTIVITAMIN CH: 1.0000 | ORAL_TABLET | Freq: Every day | ORAL | Status: DC

## 2022-05-27 MED ORDER — DOCUSATE SODIUM 100 MG PO CAPS: 100.0000 mg | ORAL_CAPSULE | Freq: Every day | ORAL | Status: DC

## 2022-05-27 MED ORDER — SODIUM CHLORIDE 0.9 % IV SOLN
2.0000 g | INTRAVENOUS | Status: DC
  Filled 2022-05-27: qty 20

## 2022-05-27 NOTE — MAU Note (Signed)
.  Nancy Davis is a 47 y.o. at 61w1dhere in MAU. [facility] Pt has cervical ectopic as well and will make plan of care for treatment as well LMP: 04/07/22 Onset of complaint: couple days Pain score: 10 Vitals:   05/27/22 2357 05/27/22 2358  BP:  132/76  Pulse:    Resp:    Temp:    SpO2: 98%      FHT:n/a Lab orders placed from triage:  none

## 2022-05-27 NOTE — ED Provider Notes (Signed)
[facility] Provider Note   CSN: [id number] Arrival date & time: 05/27/22  1156     History  Chief Complaint  Patient presents with   Back Pain    Nancy Davis is a 47 y.o. female.   Back Pain Patient with back pain.  Left lower quadrant going to left flank.  Has had vomiting.  Currently on her menses.  Denies pregnancy.  Does not think there is hematuria but on her menses.  States she had kidney stones when she was 72.  Last menses was last month and was much milder than normal.  Had told previous provider not sexually active.    Past Medical History:  Diagnosis Date   Anemia    yrs ago   Dislocation of left knee with medial meniscus tear    Fibroid    Hypertension    Knee pain, left    from old injury playing sports in high school,     Home Medications Prior to Admission medications   Medication Sig Start Date End Date Taking? Authorizing Provider  acetaminophen (TYLENOL) 500 MG tablet Take 500 mg by mouth every 6 (six) hours as needed for mild pain or moderate pain.    [provider]  amLODipine (NORVASC) 10 MG tablet Take 1 tablet (10 mg total) by mouth daily. 10/23/20   Duke, Tami Lin, PA  CELEBREX 200 MG capsule Take 200 mg by mouth 2 (two) times daily. 12/31/21   [provider]  diclofenac Sodium (VOLTAREN) 1 % GEL Apply 2 g topically 4 (four) times daily. 06/20/21   Mickie Hillier, PA-C  ibuprofen (ADVIL) 800 MG tablet Take 1 tablet (800 mg total) by mouth 3 (three) times daily. 10/16/21   Sherrill Raring, PA-C  lisinopril-hydrochlorothiazide (ZESTORETIC) 10-12.5 MG tablet Take 1 tablet by mouth daily. 07/14/20 03/01/22  Cherene Altes, MD  methylPREDNISolone (MEDROL DOSEPAK) 4 MG TBPK tablet Use as directed on package 03/13/22   Wyvonnia Dusky, MD  Multiple Vitamins-Minerals (MULTIVITAMIN WITH MINERALS) tablet Take 1 tablet by mouth daily. Woman    [provider]   naphazoline-pheniramine (VISINE) 0.025-0.3 % ophthalmic solution Place 1 drop into both eyes 4 (four) times daily as needed for eye irritation.    [provider]  OVER THE COUNTER MEDICATION Take by mouth daily. Vitamin b 12 sl liquid    [provider]  Relugolix-Estradiol-Norethind (MYFEMBREE) 40-1-0.5 MG TABS Take 1 tablet by mouth daily. 03/01/22   Chrzanowski, Jami B, NP  rosuvastatin (CRESTOR) 5 MG tablet Take 1 tablet (5 mg total) by mouth daily. 08/12/20 01/27/22  Ledora Bottcher, PA      Allergies    Penicillins and Coconut (cocos nucifera)    Review of Systems   Review of Systems  Musculoskeletal:  Positive for back pain.    Physical Exam Updated Vital Signs BP (!) 138/113 (BP Location: Right Arm)   Pulse 86   Temp 98.3 F (36.8 C) (Oral)   Resp 16   SpO2 99%  Physical Exam Vitals and nursing note reviewed.  Eyes:     Pupils: Pupils are equal, round, and reactive to light.  Cardiovascular:     Rate and Rhythm: Regular rhythm.  Pulmonary:     Breath sounds: No rhonchi.  Abdominal:     Tenderness: There is abdominal tenderness.  Genitourinary:    Comments: CVA to the left. Musculoskeletal:     Cervical back: Neck supple.  Neurological:  Mental Status: She is alert and oriented to person, place, and time.     ED Results / Procedures / Treatments   Labs (all labs ordered are listed, but only abnormal results are displayed) Labs Reviewed  CBC WITH DIFFERENTIAL/PLATELET - Abnormal; Notable for the following components:      Result Value   RBC 5.29 (*)    Hemoglobin 11.1 (*)    HCT 35.7 (*)    MCV 67.5 (*)    MCH 21.0 (*)    RDW 17.9 (*)    All other components within normal limits  COMPREHENSIVE METABOLIC PANEL - Abnormal; Notable for the following components:   Calcium 8.6 (*)    Albumin 3.3 (*)    All other components within normal limits  URINALYSIS, ROUTINE W REFLEX MICROSCOPIC - Abnormal; Notable for the following  components:   Color, Urine RED (*)    APPearance CLOUDY (*)    Hgb urine dipstick LARGE (*)    Protein, ur >300 (*)    Nitrite POSITIVE (*)    Leukocytes,Ua MODERATE (*)    All other components within normal limits  HCG, QUANTITATIVE, PREGNANCY - Abnormal; Notable for the following components:   hCG, Beta Chain, Quant, S 1,550 (*)    All other components within normal limits  URINALYSIS, MICROSCOPIC (REFLEX) - Abnormal; Notable for the following components:   Bacteria, UA FEW (*)    All other components within normal limits  I-STAT BETA HCG BLOOD, ED ([facility], [facility], [facility] ONLY) - Abnormal; Notable for the following components:   I-stat hCG, quantitative 1,403.3 (*)    All other components within normal limits  LIPASE, BLOOD  ABO/RH    EKG None  Radiology No results found.  Procedures Procedures    Medications Ordered in ED Medications - No data to display  ED Course/ Medical Decision Making/ A&P                             Medical Decision Making Amount and/or Complexity of Data Reviewed Labs: ordered. Radiology: ordered.   Patient with left lower quadrant abdominal pain and left flank pain.  Currently on her menses per patient and denies possibility of pregnancy.  However pregnancy test is positive at 1500.  Last menses that was normal besides right now was 2 months ago.  Initially denied having intercourse but now states she has had some unprotected sex.  However urinalysis does show infection.  hCG is elevated on recheck.  Differential diagnose includes kidney stone, pregnancy, ectopic pregnancy, mass.  Will get basic blood work.  White count reassuring.  Hemoglobin is 11.1 which appears to be close to baseline as compared to 4 months ago 11 months ago and a year ago.  Will get ultrasound to evaluate for potential ectopic and ultrasound to evaluate for potential renal obstruction on the left.        Final Clinical Impression(s) / ED Diagnoses Final diagnoses:  Urinary  tract infection with hematuria, site unspecified    Rx / DC Orders ED Discharge Orders     None         Davonna Belling, MD 05/27/22 1554

## 2022-05-27 NOTE — ED Provider Triage Note (Signed)
Emergency Medicine Provider Triage Evaluation Note  Nancy Davis , a 47 y.o. female  was evaluated in triage.  Pt complains of left lower quadrant abdominal pain which radiates into her flank.  Started yesterday, worsened today.  Associated with 1 episode of emesis this morning, patient is currently on her menstrual cycle unable to differentiate if there is hematuria.  History of C-section, no other abdominal surgeries.  History of previous kidney stones and this feels different.  Not sexually active, denies discharge.   Review of Systems  Per HPI  Physical Exam  BP (!) 138/113 (BP Location: Right Arm)   Pulse 86   Temp 98.3 F (36.8 C) (Oral)   Resp 16   SpO2 99%  Gen:   Awake, no distress   Resp:  Normal effort  MSK:   Moves extremities without difficulty  Other:  LLQ TTP, +LCVAT  Medical Decision Making  Medically screening exam initiated at 12:20 PM.  Appropriate orders placed.  Nancy Davis was informed that the remainder of the evaluation will be completed by another provider, this initial triage assessment does not replace that evaluation, and the importance of remaining in the ED until their evaluation is complete.     Sherrill Raring, PA-C 05/27/22 1221

## 2022-05-27 NOTE — ED Triage Notes (Signed)
Patient complains of left lower back pain that started yesterday, then got worse this morning waking her from sleep at 0400. Patient also reports an episode of vomiting this morning. Patient denies dysuria, is alert, oriented, and in no apparent distress at this time.

## 2022-05-27 NOTE — MAU Provider Note (Signed)
Chief Complaint:  Back Pain  HPI: Nancy Davis is a 47 y.o. G2P1001 at Unknown who presents to maternity admissions reporting left lower back/flank and left lower abdominal pain that started yesterday and then woke her up around 0400 this morning, also caused an episode of emesis. Has had some very mild spotting with wiping but thought it was her period about to come on. Denies any other symptoms including dysuria, heavy vaginal bleeding, menstrual cramping, fever, falls or recent illness.   Pregnancy Course: Did not know she was pregnant, presented to [facility] for evaluation of the back pain and had a full workup showing a UTI with possible pyelo as well as a pregnancy that is possibly a cervical ectopic (vs MC in progress). ED transferred her to MAU per Dr. Elgie Congo who requested she be re-scanned by our sonographer.   Past Medical History:  Diagnosis Date   Anemia    yrs ago   Dislocation of left knee with medial meniscus tear    Fibroid    Hypertension    Knee pain, left    from old injury playing sports in high school,    OB History  Gravida Para Term Preterm AB Living  '2 1 1 '$ 0 0 1  SAB IAB Ectopic Multiple Live Births  0 0     1    # Outcome Date GA Lbr Len/2nd Weight Sex Delivery Anes PTL Lv  2 Current           1 Term 09/23/05    F CS-Unspec   LIV   Past Surgical History:  Procedure Laterality Date   CESAREAN SECTION  2007   KNEE ARTHROSCOPY WITH MEDIAL MENISECTOMY Right 01/01/2020   Procedure: KNEE ARTHROSCOPY WITH MEDIAL MENISECTOMY and chrondroplasty;  Surgeon: Renette Butters, MD;  Location: [facility];  Service: Orthopedics;  Laterality: Right;   SVT ABLATION N/A 10/27/2020   Procedure: SVT ABLATION;  Surgeon: Evans Lance, MD;  Location: [facility];  Service: Cardiovascular;  Laterality: N/A;   Family History  Problem Relation Age of Onset   Heart disease Mother    Thyroid disease Mother    Osteoarthritis Mother    Prostate cancer Father     Thyroid disease Sister    Breast cancer Sister        under 39 at onset   Breast cancer Maternal Aunt        over 67 at onset   Osteoarthritis Maternal Aunt    Osteoarthritis Maternal Grandmother    Breast cancer Paternal Grandmother        over 62 at onset   Social History   Tobacco Use   Smoking status: Never    Passive exposure: Never   Smokeless tobacco: Never  Vaping Use   Vaping Use: Never used  Substance Use Topics   Alcohol use: No   Drug use: No   Allergies  Allergen Reactions   Penicillins Anaphylaxis   Coconut (Cocos Nucifera) Hives   No medications prior to admission.   I have reviewed patient's Past Medical Hx, Surgical Hx, Family Hx, Social Hx, medications and allergies.   ROS:  Pertinent items noted in HPI and remainder of comprehensive ROS otherwise negative.   Physical Exam  Patient Vitals for the past 24 hrs:  BP Temp Temp src Pulse Resp SpO2 Height Weight  05/27/22 1836 (!) 146/79 99.3 F (37.4 C) Oral 80 20 99 % -- --  05/27/22 1830 -- -- -- -- -- --  $'5\' 8"'t$  (1.727 m) 195 lb 4.8 oz (88.6 kg)  05/27/22 1208 (!) 138/113 98.3 F (36.8 C) Oral 86 16 99 % -- --   Physical Exam Vitals and nursing note reviewed.  Constitutional:      General: She is not in acute distress.    Appearance: Normal appearance. She is not ill-appearing.  HENT:     Head: Normocephalic and atraumatic.  Eyes:     Pupils: Pupils are equal, round, and reactive to light.  Cardiovascular:     Rate and Rhythm: Normal rate.  Pulmonary:     Effort: Pulmonary effort is normal.  Abdominal:     Palpations: Abdomen is soft.     Tenderness: There is abdominal tenderness (left lower). There is left CVA tenderness (acutely tender).  Musculoskeletal:        General: Normal range of motion.  Skin:    General: Skin is warm and dry.     Capillary Refill: Capillary refill takes less than 2 seconds.  Neurological:     Mental Status: She is alert and oriented to person, place, and time.   Psychiatric:        Mood and Affect: Mood normal.        Behavior: Behavior normal.        Thought Content: Thought content normal.        Judgment: Judgment normal.    Labs: Results for orders placed or performed during the hospital encounter of 05/27/22 (from the past 24 hour(s))  CBC with Differential     Status: Abnormal   Collection Time: 05/27/22 12:50 PM  Result Value Ref Range   WBC 8.5 4.0 - 10.5 K/uL   RBC 5.29 (H) 3.87 - 5.11 MIL/uL   Hemoglobin 11.1 (L) 12.0 - 15.0 g/dL   HCT 35.7 (L) 36.0 - 46.0 %   MCV 67.5 (L) 80.0 - 100.0 fL   MCH 21.0 (L) 26.0 - 34.0 pg   MCHC 31.1 30.0 - 36.0 g/dL   RDW 17.9 (H) 11.5 - 15.5 %   Platelets 241 150 - 400 K/uL   nRBC 0.0 0.0 - 0.2 %   Neutrophils Relative % 59 %   Neutro Abs 5.0 1.7 - 7.7 K/uL   Lymphocytes Relative 33 %   Lymphs Abs 2.8 0.7 - 4.0 K/uL   Monocytes Relative 6 %   Monocytes Absolute 0.5 0.1 - 1.0 K/uL   Eosinophils Relative 2 %   Eosinophils Absolute 0.2 0.0 - 0.5 K/uL   Basophils Relative 0 %   Basophils Absolute 0.0 0.0 - 0.1 K/uL   WBC Morphology WHITE COUNT CONFIRMED ON SMEAR    RBC Morphology MORPHOLOGY UNREMARKABLE    Smear Review PLATELET COUNT CONFIRMED BY SMEAR    nRBC 0 0 /100 WBC   Abs Immature Granulocytes 0.00 0.00 - 0.07 K/uL  Comprehensive metabolic panel     Status: Abnormal   Collection Time: 05/27/22 12:50 PM  Result Value Ref Range   Sodium 136 135 - 145 mmol/L   Potassium 3.5 3.5 - 5.1 mmol/L   Chloride 102 98 - 111 mmol/L   CO2 24 22 - 32 mmol/L   Glucose, Bld 93 70 - 99 mg/dL   BUN 9 6 - 20 mg/dL   Creatinine, Ser 0.73 0.44 - 1.00 mg/dL   Calcium 8.6 (L) 8.9 - 10.3 mg/dL   Total Protein 6.9 6.5 - 8.1 g/dL   Albumin 3.3 (L) 3.5 - 5.0 g/dL   AST 15 15 - 41  U/L   ALT 8 0 - 44 U/L   Alkaline Phosphatase 54 38 - 126 U/L   Total Bilirubin 0.7 0.3 - 1.2 mg/dL   GFR, Estimated >60 >60 mL/min   Anion gap 10 5 - 15  Lipase, blood     Status: None   Collection Time: 05/27/22 12:50 PM   Result Value Ref Range   Lipase 29 11 - 51 U/L  I-Stat beta hCG blood, ED     Status: Abnormal   Collection Time: 05/27/22  1:20 PM  Result Value Ref Range   I-stat hCG, quantitative 1,403.3 (H) <5 mIU/mL   Comment 3          hCG, quantitative, pregnancy     Status: Abnormal   Collection Time: 05/27/22  1:40 PM  Result Value Ref Range   hCG, Beta Chain, Quant, S 1,550 (H) <5 mIU/mL  Urinalysis, Routine w reflex microscopic -Urine, Clean Catch     Status: Abnormal   Collection Time: 05/27/22  1:45 PM  Result Value Ref Range   Color, Urine RED (A) YELLOW   APPearance CLOUDY (A) CLEAR   Specific Gravity, Urine 1.025 1.005 - 1.030   pH 5.0 5.0 - 8.0   Glucose, UA NEGATIVE NEGATIVE mg/dL   Hgb urine dipstick LARGE (A) NEGATIVE   Bilirubin Urine NEGATIVE NEGATIVE   Ketones, ur NEGATIVE NEGATIVE mg/dL   Protein, ur >300 (A) NEGATIVE mg/dL   Nitrite POSITIVE (A) NEGATIVE   Leukocytes,Ua MODERATE (A) NEGATIVE  Urinalysis, Microscopic (reflex)     Status: Abnormal   Collection Time: 05/27/22  1:45 PM  Result Value Ref Range   RBC / HPF >50 0 - 5 RBC/hpf   WBC, UA 21-50 0 - 5 WBC/hpf   Bacteria, UA FEW (A) NONE SEEN   Squamous Epithelial / HPF 0-5 0 - 5 /HPF   Mucus PRESENT   ABO/Rh     Status: None   Collection Time: 05/27/22  6:24 PM  Result Value Ref Range   ABO/RH(D)      O POS Performed at [facility], [address], [place], [place] [address]    Imaging:  US OB Transvaginal  Result Date: 05/27/2022 CLINICAL DATA:  Pregnant patient with pelvic pain with previous ultrasound findings suspicious for failed pregnancy in progress versus cervical ectopic. EXAM: OBSTETRIC <14 WK [place] AND TRANSVAGINAL OB US TECHNIQUE: Both transabdominal and transvaginal ultrasound examinations were performed for complete evaluation of the gestation as well as the maternal uterus, adnexal regions, and pelvic cul-de-sac. Transvaginal technique was performed to assess early pregnancy. COMPARISON:   Obstetric ultrasound dated 05/27/2022 FINDINGS: Intrauterine gestational sac: Single Yolk sac:  Visualized. Embryo:  Visualized. Cardiac Activity: Not Visualized. Heart Rate: N/A MSD: N/A CRL:  4.6 mm   6 w   1 d                  [place] EDC: 01/19/2023 Subchorionic hemorrhage:  None visualized. Maternal uterus/adnexae: Again seen is a gestational sac containing a yolk sac and a fetal pole present within the cervix, located within the posterior cervical wall, separate from the endocervical canal. No gestational sac is visualized within the uterine body or fundus. Again seen are multiple uterine fibroids. The ovaries are not seen. No pelvic free fluid. IMPRESSION: Gestational sac containing a yolk sac and fetal pole centered within the posterior cervical wall, separate from the endocervical canal, favored to reflect a cervical ectopic pregnancy. These results were called by telephone at  the time of interpretation on 05/27/2022 at 8:45 pm to provider Gaylan Gerold, CNM, who verbally acknowledged these results. Electronically Signed   By: Darrin Nipper M.D.   On: 05/27/2022 20:49   US OB Comp < 14 Wks  Result Date: 05/27/2022 CLINICAL DATA:  Pregnant patient with pelvic and flank pain. EXAM: OBSTETRIC <14 WK [place] AND TRANSVAGINAL OB US TECHNIQUE: Both transabdominal and transvaginal ultrasound examinations were performed for complete evaluation of the gestation as well as the maternal uterus, adnexal regions, and pelvic cul-de-sac. Transvaginal technique was performed to assess early pregnancy. COMPARISON:  Pelvic ultrasound 01/20/2022 FINDINGS: Intrauterine gestational sac: Single, present in cervix. Yolk sac:  Visualized. Embryo:  Possible but not definite. Cardiac Activity: Not Visualized. MSD: 10.7 mm   5 w   6 d Subchorionic hemorrhage:  None visualized. Maternal uterus/adnexae: Gestational sac containing a yolk sac within the cervix, probable adjacent blood. Endometrium within the fundus of the uterus measures 10 mm.  There are multiple uterine fibroids, including a 2.3 cm posterior subserosal fibroid and 2.5 cm potentially submucosal fibroid. Neither ovary is definitively visualized. No adnexal mass. No pelvic free fluid. IMPRESSION: 1. Intrauterine gestational sac containing a yolk sac is present in the cervix. Differential considerations include failed pregnancy in progress versus cervical ectopic. Recommend trending of beta HCG and sonographic follow-up as indicated. 2. Uterine fibroids. 3. Neither ovary is visualized.  No adnexal mass These results were called by telephone at the time of interpretation on 05/27/2022 at 5:06 pm to provider [facility], who verbally acknowledged these results. Electronically Signed   By: Keith Rake M.D.   On: 05/27/2022 17:08   US OB Transvaginal  Result Date: 05/27/2022 CLINICAL DATA:  Pregnant patient with pelvic and flank pain. EXAM: OBSTETRIC <14 WK [place] AND TRANSVAGINAL OB US TECHNIQUE: Both transabdominal and transvaginal ultrasound examinations were performed for complete evaluation of the gestation as well as the maternal uterus, adnexal regions, and pelvic cul-de-sac. Transvaginal technique was performed to assess early pregnancy. COMPARISON:  Pelvic ultrasound 01/20/2022 FINDINGS: Intrauterine gestational sac: Single, present in cervix. Yolk sac:  Visualized. Embryo:  Possible but not definite. Cardiac Activity: Not Visualized. MSD: 10.7 mm   5 w   6 d Subchorionic hemorrhage:  None visualized. Maternal uterus/adnexae: Gestational sac containing a yolk sac within the cervix, probable adjacent blood. Endometrium within the fundus of the uterus measures 10 mm. There are multiple uterine fibroids, including a 2.3 cm posterior subserosal fibroid and 2.5 cm potentially submucosal fibroid. Neither ovary is definitively visualized. No adnexal mass. No pelvic free fluid. IMPRESSION: 1. Intrauterine gestational sac containing a yolk sac is present in the cervix. Differential considerations  include failed pregnancy in progress versus cervical ectopic. Recommend trending of beta HCG and sonographic follow-up as indicated. 2. Uterine fibroids. 3. Neither ovary is visualized.  No adnexal mass These results were called by telephone at the time of interpretation on 05/27/2022 at 5:06 pm to provider [facility], who verbally acknowledged these results. Electronically Signed   By: Keith Rake M.D.   On: 05/27/2022 17:08   US Renal  Result Date: 05/27/2022 CLINICAL DATA:  Flank pain. EXAM: RENAL / URINARY TRACT ULTRASOUND COMPLETE COMPARISON:  None Available. FINDINGS: Right Kidney: Renal measurements: 9.6 x 4.5 x 4.0 cm = volume: 91 mL. Echogenicity within normal limits. No mass or hydronephrosis visualized. Left Kidney: Renal measurements: 10.4 x 5.1 x 4.6 cm = volume: 128 mL. Echogenicity within normal limits. No mass or hydronephrosis visualized. Bladder: Appears normal for degree  of bladder distention. Other: None. IMPRESSION: Normal renal ultrasound. Electronically Signed   By: Yvonne Kendall M.D.   On: 05/27/2022 16:29    MDM: High  MAU Course: Orders Placed This Encounter  Procedures   Culture, OB Urine   US Renal   US OB Comp < 14 Wks   US OB Transvaginal   US OB Transvaginal   CBC with Differential   Comprehensive metabolic panel   Lipase, blood   Urinalysis, Routine w reflex microscopic -Urine, Clean Catch   hCG, quantitative, pregnancy   Urinalysis, Microscopic (reflex)   Diet regular Room service appropriate? Yes; Fluid consistency: Thin   Notify physician (specify)   Vital signs   Defer vaginal exam for vaginal bleeding or PROM <37 weeks   Apply Antepartum Care Plan   Initiate Oral Care Protocol   Initiate Carrier Fluid Protocol   SCDs   Activity as tolerated   Patient may shower   Full code   I-Stat beta hCG blood, ED   ABO/Rh   Type and screen [place]   Insert peripheral IV   Admit to Inpatient (patient's expected length of stay will be  greater than 2 midnights or inpatient only procedure)   Meds ordered this encounter  Medications   acetaminophen (TYLENOL) tablet 650 mg   docusate sodium (COLACE) capsule 100 mg   calcium carbonate (TUMS - dosed in mg elemental calcium) chewable tablet 400 mg of elemental calcium   cefTRIAXone (ROCEPHIN) 2 g in sodium chloride 0.9 % 100 mL IVPB    Order Specific Question:   Antibiotic Indication:    Answer:   Other Indication (list below)    Order Specific Question:   Other Indication:    Answer:   Pyelonephritis in pregnancy   Pt sent straight to ultrasound on arrival to MAU. Radiologist called to report results as a cervical ectopic. Met with patient to review all results since arrival to ED and do physical exam. Discussed likely admission which pt was amenable to but needed to get her daughter situated so began making calls.  Report given to Dr. Mikel Cella who, given presentation and CVA tenderness, requested pt be admitted for IV antibiotics and consult by Dr. Kerin Perna with likely methotrexate dosing tomorrow.  Pt unable to reach her daughter so requested to leave to get her settled with family. Signed out AMA but agreed to return tonight for admission to [facility]. Strong ectopic precautions given.  Assessment: 1. Pyelonephritis complicating pregnancy, first trimester   2. Pregnancy, ectopic, cornual or cervical     Plan: Pt signed out AMA but agreed to return tonight for admission to [facility].  Gaylan Gerold, CNM, MSN, [place] Certified Nurse Midwife, [facility]

## 2022-05-27 NOTE — MAU Note (Signed)
Pt was for admission to [facility]. However she needed to leave and take care of family matters and stated she will return tonight for admission. Gaylan Gerold CNM talked with pt and had her sign AMA papers before leaving

## 2022-05-27 NOTE — H&P (Addendum)
Chief Complaint:  Flank Pain   HPI: Nancy Davis is a 47 y.o. G2P1001 at Unknown who presents to maternity admissions reporting left lower back/flank and left lower abdominal pain that started yesterday and then woke her up around 0400 this morning, also caused an episode of emesis. Has had some very mild spotting with wiping but thought it was her period about to come on. Denies any other symptoms including dysuria, heavy vaginal bleeding, menstrual cramping, fever, falls or recent illness.   Pregnancy Course: See previous MAU note  Past Medical History:  Diagnosis Date   Anemia    yrs ago   Dislocation of left knee with medial meniscus tear    Fibroid    Hypertension    Knee pain, left    from old injury playing sports in high school,    OB History  Gravida Para Term Preterm AB Living  '2 1 1 '$ 0 0 1  SAB IAB Ectopic Multiple Live Births  0 0     1    # Outcome Date GA Lbr Len/2nd Weight Sex Delivery Anes PTL Lv  2 Current           1 Term 09/23/05    F CS-Unspec   LIV   Past Surgical History:  Procedure Laterality Date   CESAREAN SECTION  2007   KNEE ARTHROSCOPY WITH MEDIAL MENISECTOMY Right 01/01/2020   Procedure: KNEE ARTHROSCOPY WITH MEDIAL MENISECTOMY and chrondroplasty;  Surgeon: Renette Butters, MD;  Location: [facility];  Service: Orthopedics;  Laterality: Right;   SVT ABLATION N/A 10/27/2020   Procedure: SVT ABLATION;  Surgeon: Evans Lance, MD;  Location: [facility];  Service: Cardiovascular;  Laterality: N/A;   Family History  Problem Relation Age of Onset   Heart disease Mother    Thyroid disease Mother    Osteoarthritis Mother    Prostate cancer Father    Thyroid disease Sister    Breast cancer Sister        under 58 at onset   Breast cancer Maternal Aunt        over 30 at onset   Osteoarthritis Maternal Aunt    Osteoarthritis Maternal Grandmother    Breast cancer Paternal Grandmother        over 8 at onset   Social History    Tobacco Use   Smoking status: Never    Passive exposure: Never   Smokeless tobacco: Never  Vaping Use   Vaping Use: Never used  Substance Use Topics   Alcohol use: No   Drug use: No   Allergies  Allergen Reactions   Penicillins Anaphylaxis   Coconut (Cocos Nucifera) Hives   Medications Prior to Admission  Medication Sig Dispense Refill Last Dose   acetaminophen (TYLENOL) 500 MG tablet Take 500 mg by mouth every 6 (six) hours as needed for mild pain or moderate pain.      amLODipine (NORVASC) 10 MG tablet Take 1 tablet (10 mg total) by mouth daily. 90 tablet 3    CELEBREX 200 MG capsule Take 200 mg by mouth 2 (two) times daily.      diclofenac Sodium (VOLTAREN) 1 % GEL Apply 2 g topically 4 (four) times daily. 100 g 0    ibuprofen (ADVIL) 800 MG tablet Take 1 tablet (800 mg total) by mouth 3 (three) times daily. 21 tablet 0    lisinopril-hydrochlorothiazide (ZESTORETIC) 10-12.5 MG tablet Take 1 tablet by mouth daily. 30 tablet 1  methylPREDNISolone (MEDROL DOSEPAK) 4 MG TBPK tablet Use as directed on package 21 tablet 0    Multiple Vitamins-Minerals (MULTIVITAMIN WITH MINERALS) tablet Take 1 tablet by mouth daily. Woman      naphazoline-pheniramine (VISINE) 0.025-0.3 % ophthalmic solution Place 1 drop into both eyes 4 (four) times daily as needed for eye irritation.      OVER THE COUNTER MEDICATION Take by mouth daily. Vitamin b 12 sl liquid      Relugolix-Estradiol-Norethind (MYFEMBREE) 40-1-0.5 MG TABS Take 1 tablet by mouth daily. 28 tablet 2    rosuvastatin (CRESTOR) 5 MG tablet Take 1 tablet (5 mg total) by mouth daily. 30 tablet 6    I have reviewed patient's Past Medical Hx, Surgical Hx, Family Hx, Social Hx, medications and allergies.   ROS:  Pertinent items noted in HPI and remainder of comprehensive ROS otherwise negative.   Physical Exam  Patient Vitals for the past 24 hrs:  BP Temp Temp src Pulse Resp SpO2  05/28/22 0008 139/77 97.7 F (36.5 C) Oral 78 18 --   05/27/22 2358 132/76 -- -- -- -- --  05/27/22 2357 -- -- -- -- -- 98 %  05/27/22 2356 -- 98.5 F (36.9 C) -- 98 18 --    Physical Exam Vitals reviewed.  Constitutional:      Appearance: Normal appearance. She is not ill-appearing.  Eyes:     Pupils: Pupils are equal, round, and reactive to light.  Cardiovascular:     Rate and Rhythm: Normal rate and regular rhythm.  Pulmonary:     Effort: Pulmonary effort is normal.  Musculoskeletal:        General: Normal range of motion.  Skin:    General: Skin is warm and dry.  Neurological:     Mental Status: She is alert and oriented to person, place, and time.  Psychiatric:        Mood and Affect: Mood normal.        Behavior: Behavior normal.     See previous note for full exam, medical screening exam complete only prior to admission to [facility]: Results for orders placed or performed during the hospital encounter of 05/27/22 (from the past 24 hour(s))  CBC with Differential     Status: Abnormal   Collection Time: 05/27/22 12:50 PM  Result Value Ref Range   WBC 8.5 4.0 - 10.5 K/uL   RBC 5.29 (H) 3.87 - 5.11 MIL/uL   Hemoglobin 11.1 (L) 12.0 - 15.0 g/dL   HCT 35.7 (L) 36.0 - 46.0 %   MCV 67.5 (L) 80.0 - 100.0 fL   MCH 21.0 (L) 26.0 - 34.0 pg   MCHC 31.1 30.0 - 36.0 g/dL   RDW 17.9 (H) 11.5 - 15.5 %   Platelets 241 150 - 400 K/uL   nRBC 0.0 0.0 - 0.2 %   Neutrophils Relative % 59 %   Neutro Abs 5.0 1.7 - 7.7 K/uL   Lymphocytes Relative 33 %   Lymphs Abs 2.8 0.7 - 4.0 K/uL   Monocytes Relative 6 %   Monocytes Absolute 0.5 0.1 - 1.0 K/uL   Eosinophils Relative 2 %   Eosinophils Absolute 0.2 0.0 - 0.5 K/uL   Basophils Relative 0 %   Basophils Absolute 0.0 0.0 - 0.1 K/uL   WBC Morphology WHITE COUNT CONFIRMED ON SMEAR    RBC Morphology MORPHOLOGY UNREMARKABLE    Smear Review PLATELET COUNT CONFIRMED BY SMEAR    nRBC 0 0 /100 WBC  Abs Immature Granulocytes 0.00 0.00 - 0.07 K/uL  Comprehensive metabolic panel     Status:  Abnormal   Collection Time: 05/27/22 12:50 PM  Result Value Ref Range   Sodium 136 135 - 145 mmol/L   Potassium 3.5 3.5 - 5.1 mmol/L   Chloride 102 98 - 111 mmol/L   CO2 24 22 - 32 mmol/L   Glucose, Bld 93 70 - 99 mg/dL   BUN 9 6 - 20 mg/dL   Creatinine, Ser 0.73 0.44 - 1.00 mg/dL   Calcium 8.6 (L) 8.9 - 10.3 mg/dL   Total Protein 6.9 6.5 - 8.1 g/dL   Albumin 3.3 (L) 3.5 - 5.0 g/dL   AST 15 15 - 41 U/L   ALT 8 0 - 44 U/L   Alkaline Phosphatase 54 38 - 126 U/L   Total Bilirubin 0.7 0.3 - 1.2 mg/dL   GFR, Estimated >60 >60 mL/min   Anion gap 10 5 - 15  Lipase, blood     Status: None   Collection Time: 05/27/22 12:50 PM  Result Value Ref Range   Lipase 29 11 - 51 U/L  I-Stat beta hCG blood, ED     Status: Abnormal   Collection Time: 05/27/22  1:20 PM  Result Value Ref Range   I-stat hCG, quantitative 1,403.3 (H) <5 mIU/mL   Comment 3          hCG, quantitative, pregnancy     Status: Abnormal   Collection Time: 05/27/22  1:40 PM  Result Value Ref Range   hCG, Beta Chain, Quant, S 1,550 (H) <5 mIU/mL  Urinalysis, Routine w reflex microscopic -Urine, Clean Catch     Status: Abnormal   Collection Time: 05/27/22  1:45 PM  Result Value Ref Range   Color, Urine RED (A) YELLOW   APPearance CLOUDY (A) CLEAR   Specific Gravity, Urine 1.025 1.005 - 1.030   pH 5.0 5.0 - 8.0   Glucose, UA NEGATIVE NEGATIVE mg/dL   Hgb urine dipstick LARGE (A) NEGATIVE   Bilirubin Urine NEGATIVE NEGATIVE   Ketones, ur NEGATIVE NEGATIVE mg/dL   Protein, ur >300 (A) NEGATIVE mg/dL   Nitrite POSITIVE (A) NEGATIVE   Leukocytes,Ua MODERATE (A) NEGATIVE  Urinalysis, Microscopic (reflex)     Status: Abnormal   Collection Time: 05/27/22  1:45 PM  Result Value Ref Range   RBC / HPF >50 0 - 5 RBC/hpf   WBC, UA 21-50 0 - 5 WBC/hpf   Bacteria, UA FEW (A) NONE SEEN   Squamous Epithelial / HPF 0-5 0 - 5 /HPF   Mucus PRESENT   ABO/Rh     Status: None   Collection Time: 05/27/22  6:24 PM  Result Value Ref  Range   ABO/RH(D)      O POS Performed at [facility], [address], [place], [place] [address]     Imaging:  US OB Transvaginal  Result Date: 05/27/2022 CLINICAL DATA:  Pregnant patient with pelvic pain with previous ultrasound findings suspicious for failed pregnancy in progress versus cervical ectopic. EXAM: OBSTETRIC <14 WK [place] AND TRANSVAGINAL OB US TECHNIQUE: Both transabdominal and transvaginal ultrasound examinations were performed for complete evaluation of the gestation as well as the maternal uterus, adnexal regions, and pelvic cul-de-sac. Transvaginal technique was performed to assess early pregnancy. COMPARISON:  Obstetric ultrasound dated 05/27/2022 FINDINGS: Intrauterine gestational sac: Single Yolk sac:  Visualized. Embryo:  Visualized. Cardiac Activity: Not Visualized. Heart Rate: N/A MSD: N/A CRL:  4.6 mm  6 w   1 d                  [place] EDC: 01/19/2023 Subchorionic hemorrhage:  None visualized. Maternal uterus/adnexae: Again seen is a gestational sac containing a yolk sac and a fetal pole present within the cervix, located within the posterior cervical wall, separate from the endocervical canal. No gestational sac is visualized within the uterine body or fundus. Again seen are multiple uterine fibroids. The ovaries are not seen. No pelvic free fluid. IMPRESSION: Gestational sac containing a yolk sac and fetal pole centered within the posterior cervical wall, separate from the endocervical canal, favored to reflect a cervical ectopic pregnancy. These results were called by telephone at the time of interpretation on 05/27/2022 at 8:45 pm to provider Gaylan Gerold, CNM, who verbally acknowledged these results. Electronically Signed   By: Darrin Nipper M.D.   On: 05/27/2022 20:49   US OB Comp < 14 Wks  Result Date: 05/27/2022 CLINICAL DATA:  Pregnant patient with pelvic and flank pain. EXAM: OBSTETRIC <14 WK [place] AND TRANSVAGINAL OB US TECHNIQUE: Both transabdominal and transvaginal  ultrasound examinations were performed for complete evaluation of the gestation as well as the maternal uterus, adnexal regions, and pelvic cul-de-sac. Transvaginal technique was performed to assess early pregnancy. COMPARISON:  Pelvic ultrasound 01/20/2022 FINDINGS: Intrauterine gestational sac: Single, present in cervix. Yolk sac:  Visualized. Embryo:  Possible but not definite. Cardiac Activity: Not Visualized. MSD: 10.7 mm   5 w   6 d Subchorionic hemorrhage:  None visualized. Maternal uterus/adnexae: Gestational sac containing a yolk sac within the cervix, probable adjacent blood. Endometrium within the fundus of the uterus measures 10 mm. There are multiple uterine fibroids, including a 2.3 cm posterior subserosal fibroid and 2.5 cm potentially submucosal fibroid. Neither ovary is definitively visualized. No adnexal mass. No pelvic free fluid. IMPRESSION: 1. Intrauterine gestational sac containing a yolk sac is present in the cervix. Differential considerations include failed pregnancy in progress versus cervical ectopic. Recommend trending of beta HCG and sonographic follow-up as indicated. 2. Uterine fibroids. 3. Neither ovary is visualized.  No adnexal mass These results were called by telephone at the time of interpretation on 05/27/2022 at 5:06 pm to provider [facility], who verbally acknowledged these results. Electronically Signed   By: Keith Rake M.D.   On: 05/27/2022 17:08   US OB Transvaginal  Result Date: 05/27/2022 CLINICAL DATA:  Pregnant patient with pelvic and flank pain. EXAM: OBSTETRIC <14 WK [place] AND TRANSVAGINAL OB US TECHNIQUE: Both transabdominal and transvaginal ultrasound examinations were performed for complete evaluation of the gestation as well as the maternal uterus, adnexal regions, and pelvic cul-de-sac. Transvaginal technique was performed to assess early pregnancy. COMPARISON:  Pelvic ultrasound 01/20/2022 FINDINGS: Intrauterine gestational sac: Single, present in cervix. Yolk  sac:  Visualized. Embryo:  Possible but not definite. Cardiac Activity: Not Visualized. MSD: 10.7 mm   5 w   6 d Subchorionic hemorrhage:  None visualized. Maternal uterus/adnexae: Gestational sac containing a yolk sac within the cervix, probable adjacent blood. Endometrium within the fundus of the uterus measures 10 mm. There are multiple uterine fibroids, including a 2.3 cm posterior subserosal fibroid and 2.5 cm potentially submucosal fibroid. Neither ovary is definitively visualized. No adnexal mass. No pelvic free fluid. IMPRESSION: 1. Intrauterine gestational sac containing a yolk sac is present in the cervix. Differential considerations include failed pregnancy in progress versus cervical ectopic. Recommend trending of beta HCG and sonographic follow-up as indicated. 2. Uterine fibroids.  3. Neither ovary is visualized.  No adnexal mass These results were called by telephone at the time of interpretation on 05/27/2022 at 5:06 pm to provider [facility], who verbally acknowledged these results. Electronically Signed   By: Keith Rake M.D.   On: 05/27/2022 17:08   US Renal  Result Date: 05/27/2022 CLINICAL DATA:  Flank pain. EXAM: RENAL / URINARY TRACT ULTRASOUND COMPLETE COMPARISON:  None Available. FINDINGS: Right Kidney: Renal measurements: 9.6 x 4.5 x 4.0 cm = volume: 91 mL. Echogenicity within normal limits. No mass or hydronephrosis visualized. Left Kidney: Renal measurements: 10.4 x 5.1 x 4.6 cm = volume: 128 mL. Echogenicity within normal limits. No mass or hydronephrosis visualized. Bladder: Appears normal for degree of bladder distention. Other: None. IMPRESSION: Normal renal ultrasound. Electronically Signed   By: Yvonne Kendall M.D.   On: 05/27/2022 16:29     Orders Placed This Encounter  Procedures   Diet regular Room service appropriate? Yes; Fluid consistency: Thin   Notify physician (specify)   Vital signs   Defer vaginal exam for vaginal bleeding or PROM <37 weeks   Apply Antepartum  Care Plan   Initiate Oral Care Protocol   Initiate [place] Fluid Protocol   Full code   Type and screen [place] in observation (patient's expected length of stay will be less than 2 midnights)   Meds ordered this encounter  Medications   lactated ringers infusion   acetaminophen (TYLENOL) tablet 650 mg   docusate sodium (COLACE) capsule 100 mg   calcium carbonate (TUMS - dosed in mg elemental calcium) chewable tablet 400 mg of elemental calcium   prenatal multivitamin tablet 1 tablet   ondansetron (ZOFRAN-ODT) disintegrating tablet 8 mg   cefTRIAXone (ROCEPHIN) 2 g in sodium chloride 0.9 % 100 mL IVPB    Order Specific Question:   Antibiotic Indication:    Answer:   UTI   Assessment: 1. Pyelonephritis   2. Pregnancy, ectopic, cornual or cervical    Plan: Admit to [facility] for IV antibiotics Rocephin ordered per pharmacy recommendation given known anaphylaxis to PCN Dr. Mikel Cella awaiting consult from Dr. Kerin Perna, most likely plan is methotrexate dose with close specialist follow up  Gaylan Gerold, CNM, MSN, [place] Certified Nurse Midwife, [place]

## 2022-05-27 NOTE — ED Provider Notes (Signed)
Patient handed off to me.  Positive pregnancy test.  Appears to have a UTI.  Renal ultrasound was performed that showed no hydronephrosis.  Have no concern for kidney stone.  Ultrasound does show intrauterine gestational sac containing yolk sac present in the cervix.  Could be failed pregnancy or cervical ectopic pregnancy.  Radiology called me on the phone about this.  Talked with MAU provider and will transfer her to the [facility] team for further workup.  Patient aware of everything.  This chart was dictated using voice recognition software.  Despite best efforts to proofread,  errors can occur which can change the documentation meaning.    Lennice Sites, DO 05/27/22 1737

## 2022-05-27 NOTE — MAU Note (Signed)
Nancy Davis is a 47 y.o. at Unknown here in MAU reporting: was transferred from [facility] ED.  States ED MD informed her she had a miscarriage but needed to be seen in MAU for further evaluation.  Reports spotting with wiping and lower abdominal cramping similar to menstrual cramps. LMP: January 2024 Onset of complaint: yesterday Pain score: 9 Vitals:   05/27/22 1208  BP: (!) 138/113  Pulse: 86  Resp: 16  Temp: 98.3 F (36.8 C)  SpO2: 99%     FHT:NA Lab orders placed from triage:   None

## 2022-05-27 NOTE — H&P (Incomplete)
Chief Complaint:  No chief complaint on file.   HPI: RAYMONA BROACH is a 47 y.o. G2P1001 at Unknown who presents to maternity admissions reporting left lower back/flank and left lower abdominal pain that started yesterday and then woke her up around 0400 this morning, also caused an episode of emesis. Has had some very mild spotting with wiping but thought it was her period about to come on. Denies any other symptoms including dysuria, heavy vaginal bleeding, menstrual cramping, fever, falls or recent illness.   Pregnancy Course: See previous MAU note  Past Medical History:  Diagnosis Date  . Anemia    yrs ago  . Dislocation of left knee with medial meniscus tear   . Fibroid   . Hypertension   . Knee pain, left    from old injury playing sports in high school,    OB History  Gravida Para Term Preterm AB Living  '2 1 1 '$ 0 0 1  SAB IAB Ectopic Multiple Live Births  0 0     1    # Outcome Date GA Lbr Len/2nd Weight Sex Delivery Anes PTL Lv  2 Current           1 Term 09/23/05    F CS-Unspec   LIV   Past Surgical History:  Procedure Laterality Date  . CESAREAN SECTION  2007  . KNEE ARTHROSCOPY WITH MEDIAL MENISECTOMY Right 01/01/2020   Procedure: KNEE ARTHROSCOPY WITH MEDIAL MENISECTOMY and chrondroplasty;  Surgeon: Renette Butters, MD;  Location: [facility];  Service: Orthopedics;  Laterality: Right;  . SVT ABLATION N/A 10/27/2020   Procedure: SVT ABLATION;  Surgeon: Evans Lance, MD;  Location: [facility];  Service: Cardiovascular;  Laterality: N/A;   Family History  Problem Relation Age of Onset  . Heart disease Mother   . Thyroid disease Mother   . Osteoarthritis Mother   . Prostate cancer Father   . Thyroid disease Sister   . Breast cancer Sister        under 19 at onset  . Breast cancer Maternal Aunt        over 50 at onset  . Osteoarthritis Maternal Aunt   . Osteoarthritis Maternal Grandmother   . Breast cancer Paternal Grandmother         over 62 at onset   Social History   Tobacco Use  . Smoking status: Never    Passive exposure: Never  . Smokeless tobacco: Never  Vaping Use  . Vaping Use: Never used  Substance Use Topics  . Alcohol use: No  . Drug use: No   Allergies  Allergen Reactions  . Penicillins Anaphylaxis  . Coconut (Cocos Nucifera) Hives   Medications Prior to Admission  Medication Sig Dispense Refill Last Dose  . acetaminophen (TYLENOL) 500 MG tablet Take 500 mg by mouth every 6 (six) hours as needed for mild pain or moderate pain.     Marland Kitchen amLODipine (NORVASC) 10 MG tablet Take 1 tablet (10 mg total) by mouth daily. 90 tablet 3   . CELEBREX 200 MG capsule Take 200 mg by mouth 2 (two) times daily.     . diclofenac Sodium (VOLTAREN) 1 % GEL Apply 2 g topically 4 (four) times daily. 100 g 0   . ibuprofen (ADVIL) 800 MG tablet Take 1 tablet (800 mg total) by mouth 3 (three) times daily. 21 tablet 0   . lisinopril-hydrochlorothiazide (ZESTORETIC) 10-12.5 MG tablet Take 1 tablet by mouth daily. 30 tablet  1   . methylPREDNISolone (MEDROL DOSEPAK) 4 MG TBPK tablet Use as directed on package 21 tablet 0   . Multiple Vitamins-Minerals (MULTIVITAMIN WITH MINERALS) tablet Take 1 tablet by mouth daily. Woman     . naphazoline-pheniramine (VISINE) 0.025-0.3 % ophthalmic solution Place 1 drop into both eyes 4 (four) times daily as needed for eye irritation.     Marland Kitchen OVER THE COUNTER MEDICATION Take by mouth daily. Vitamin b 12 sl liquid     . Relugolix-Estradiol-Norethind (MYFEMBREE) 40-1-0.5 MG TABS Take 1 tablet by mouth daily. 28 tablet 2   . rosuvastatin (CRESTOR) 5 MG tablet Take 1 tablet (5 mg total) by mouth daily. 30 tablet 6    I have reviewed patient's Past Medical Hx, Surgical Hx, Family Hx, Social Hx, medications and allergies.   ROS:  Pertinent items noted in HPI and remainder of comprehensive ROS otherwise negative.   Physical Exam  No data found.  Physical Exam Vitals reviewed.  Constitutional:       Appearance: Normal appearance. She is not ill-appearing.  Eyes:     Pupils: Pupils are equal, round, and reactive to light.  Cardiovascular:     Rate and Rhythm: Normal rate and regular rhythm.  Pulmonary:     Effort: Pulmonary effort is normal.  Musculoskeletal:        General: Normal range of motion.  Skin:    General: Skin is warm and dry.  Neurological:     Mental Status: She is alert and oriented to person, place, and time.  Psychiatric:        Mood and Affect: Mood normal.        Behavior: Behavior normal.     See previous note for full exam, medical screening exam complete only prior to admission to [facility]: Results for orders placed or performed during the hospital encounter of 05/27/22 (from the past 24 hour(s))  CBC with Differential     Status: Abnormal   Collection Time: 05/27/22 12:50 PM  Result Value Ref Range   WBC 8.5 4.0 - 10.5 K/uL   RBC 5.29 (H) 3.87 - 5.11 MIL/uL   Hemoglobin 11.1 (L) 12.0 - 15.0 g/dL   HCT 35.7 (L) 36.0 - 46.0 %   MCV 67.5 (L) 80.0 - 100.0 fL   MCH 21.0 (L) 26.0 - 34.0 pg   MCHC 31.1 30.0 - 36.0 g/dL   RDW 17.9 (H) 11.5 - 15.5 %   Platelets 241 150 - 400 K/uL   nRBC 0.0 0.0 - 0.2 %   Neutrophils Relative % 59 %   Neutro Abs 5.0 1.7 - 7.7 K/uL   Lymphocytes Relative 33 %   Lymphs Abs 2.8 0.7 - 4.0 K/uL   Monocytes Relative 6 %   Monocytes Absolute 0.5 0.1 - 1.0 K/uL   Eosinophils Relative 2 %   Eosinophils Absolute 0.2 0.0 - 0.5 K/uL   Basophils Relative 0 %   Basophils Absolute 0.0 0.0 - 0.1 K/uL   WBC Morphology WHITE COUNT CONFIRMED ON SMEAR    RBC Morphology MORPHOLOGY UNREMARKABLE    Smear Review PLATELET COUNT CONFIRMED BY SMEAR    nRBC 0 0 /100 WBC   Abs Immature Granulocytes 0.00 0.00 - 0.07 K/uL  Comprehensive metabolic panel     Status: Abnormal   Collection Time: 05/27/22 12:50 PM  Result Value Ref Range   Sodium 136 135 - 145 mmol/L   Potassium 3.5 3.5 - 5.1 mmol/L   Chloride 102 98 - 111  mmol/L   CO2 24 22 -  32 mmol/L   Glucose, Bld 93 70 - 99 mg/dL   BUN 9 6 - 20 mg/dL   Creatinine, Ser 0.73 0.44 - 1.00 mg/dL   Calcium 8.6 (L) 8.9 - 10.3 mg/dL   Total Protein 6.9 6.5 - 8.1 g/dL   Albumin 3.3 (L) 3.5 - 5.0 g/dL   AST 15 15 - 41 U/L   ALT 8 0 - 44 U/L   Alkaline Phosphatase 54 38 - 126 U/L   Total Bilirubin 0.7 0.3 - 1.2 mg/dL   GFR, Estimated >60 >60 mL/min   Anion gap 10 5 - 15  Lipase, blood     Status: None   Collection Time: 05/27/22 12:50 PM  Result Value Ref Range   Lipase 29 11 - 51 U/L  I-Stat beta hCG blood, ED     Status: Abnormal   Collection Time: 05/27/22  1:20 PM  Result Value Ref Range   I-stat hCG, quantitative 1,403.3 (H) <5 mIU/mL   Comment 3          hCG, quantitative, pregnancy     Status: Abnormal   Collection Time: 05/27/22  1:40 PM  Result Value Ref Range   hCG, Beta Chain, Quant, S 1,550 (H) <5 mIU/mL  Urinalysis, Routine w reflex microscopic -Urine, Clean Catch     Status: Abnormal   Collection Time: 05/27/22  1:45 PM  Result Value Ref Range   Color, Urine RED (A) YELLOW   APPearance CLOUDY (A) CLEAR   Specific Gravity, Urine 1.025 1.005 - 1.030   pH 5.0 5.0 - 8.0   Glucose, UA NEGATIVE NEGATIVE mg/dL   Hgb urine dipstick LARGE (A) NEGATIVE   Bilirubin Urine NEGATIVE NEGATIVE   Ketones, ur NEGATIVE NEGATIVE mg/dL   Protein, ur >300 (A) NEGATIVE mg/dL   Nitrite POSITIVE (A) NEGATIVE   Leukocytes,Ua MODERATE (A) NEGATIVE  Urinalysis, Microscopic (reflex)     Status: Abnormal   Collection Time: 05/27/22  1:45 PM  Result Value Ref Range   RBC / HPF >50 0 - 5 RBC/hpf   WBC, UA 21-50 0 - 5 WBC/hpf   Bacteria, UA FEW (A) NONE SEEN   Squamous Epithelial / HPF 0-5 0 - 5 /HPF   Mucus PRESENT   ABO/Rh     Status: None   Collection Time: 05/27/22  6:24 PM  Result Value Ref Range   ABO/RH(D)      O POS Performed at [facility], [address] [place], [place] [address]     Imaging:  US OB Transvaginal  Result Date: 05/27/2022 CLINICAL DATA:   Pregnant patient with pelvic pain with previous ultrasound findings suspicious for failed pregnancy in progress versus cervical ectopic. EXAM: OBSTETRIC <14 WK [place] AND TRANSVAGINAL OB US TECHNIQUE: Both transabdominal and transvaginal ultrasound examinations were performed for complete evaluation of the gestation as well as the maternal uterus, adnexal regions, and pelvic cul-de-sac. Transvaginal technique was performed to assess early pregnancy. COMPARISON:  Obstetric ultrasound dated 05/27/2022 FINDINGS: Intrauterine gestational sac: Single Yolk sac:  Visualized. Embryo:  Visualized. Cardiac Activity: Not Visualized. Heart Rate: N/A MSD: N/A CRL:  4.6 mm   6 w   1 d                  [place] EDC: 01/19/2023 Subchorionic hemorrhage:  None visualized. Maternal uterus/adnexae: Again seen is a gestational sac containing a yolk sac and a fetal pole present within the cervix, located within  the posterior cervical wall, separate from the endocervical canal. No gestational sac is visualized within the uterine body or fundus. Again seen are multiple uterine fibroids. The ovaries are not seen. No pelvic free fluid. IMPRESSION: Gestational sac containing a yolk sac and fetal pole centered within the posterior cervical wall, separate from the endocervical canal, favored to reflect a cervical ectopic pregnancy. These results were called by telephone at the time of interpretation on 05/27/2022 at 8:45 pm to provider Gaylan Gerold, CNM, who verbally acknowledged these results. Electronically Signed   By: Darrin Nipper M.D.   On: 05/27/2022 20:49   US OB Comp < 14 Wks  Result Date: 05/27/2022 CLINICAL DATA:  Pregnant patient with pelvic and flank pain. EXAM: OBSTETRIC <14 WK [place] AND TRANSVAGINAL OB US TECHNIQUE: Both transabdominal and transvaginal ultrasound examinations were performed for complete evaluation of the gestation as well as the maternal uterus, adnexal regions, and pelvic cul-de-sac. Transvaginal technique was performed to  assess early pregnancy. COMPARISON:  Pelvic ultrasound 01/20/2022 FINDINGS: Intrauterine gestational sac: Single, present in cervix. Yolk sac:  Visualized. Embryo:  Possible but not definite. Cardiac Activity: Not Visualized. MSD: 10.7 mm   5 w   6 d Subchorionic hemorrhage:  None visualized. Maternal uterus/adnexae: Gestational sac containing a yolk sac within the cervix, probable adjacent blood. Endometrium within the fundus of the uterus measures 10 mm. There are multiple uterine fibroids, including a 2.3 cm posterior subserosal fibroid and 2.5 cm potentially submucosal fibroid. Neither ovary is definitively visualized. No adnexal mass. No pelvic free fluid. IMPRESSION: 1. Intrauterine gestational sac containing a yolk sac is present in the cervix. Differential considerations include failed pregnancy in progress versus cervical ectopic. Recommend trending of beta HCG and sonographic follow-up as indicated. 2. Uterine fibroids. 3. Neither ovary is visualized.  No adnexal mass These results were called by telephone at the time of interpretation on 05/27/2022 at 5:06 pm to provider [facility], who verbally acknowledged these results. Electronically Signed   By: Keith Rake M.D.   On: 05/27/2022 17:08   US OB Transvaginal  Result Date: 05/27/2022 CLINICAL DATA:  Pregnant patient with pelvic and flank pain. EXAM: OBSTETRIC <14 WK [place] AND TRANSVAGINAL OB US TECHNIQUE: Both transabdominal and transvaginal ultrasound examinations were performed for complete evaluation of the gestation as well as the maternal uterus, adnexal regions, and pelvic cul-de-sac. Transvaginal technique was performed to assess early pregnancy. COMPARISON:  Pelvic ultrasound 01/20/2022 FINDINGS: Intrauterine gestational sac: Single, present in cervix. Yolk sac:  Visualized. Embryo:  Possible but not definite. Cardiac Activity: Not Visualized. MSD: 10.7 mm   5 w   6 d Subchorionic hemorrhage:  None visualized. Maternal uterus/adnexae:  Gestational sac containing a yolk sac within the cervix, probable adjacent blood. Endometrium within the fundus of the uterus measures 10 mm. There are multiple uterine fibroids, including a 2.3 cm posterior subserosal fibroid and 2.5 cm potentially submucosal fibroid. Neither ovary is definitively visualized. No adnexal mass. No pelvic free fluid. IMPRESSION: 1. Intrauterine gestational sac containing a yolk sac is present in the cervix. Differential considerations include failed pregnancy in progress versus cervical ectopic. Recommend trending of beta HCG and sonographic follow-up as indicated. 2. Uterine fibroids. 3. Neither ovary is visualized.  No adnexal mass These results were called by telephone at the time of interpretation on 05/27/2022 at 5:06 pm to provider [facility], who verbally acknowledged these results. Electronically Signed   By: Keith Rake M.D.   On: 05/27/2022 17:08   US Renal  Result  Date: 05/27/2022 CLINICAL DATA:  Flank pain. EXAM: RENAL / URINARY TRACT ULTRASOUND COMPLETE COMPARISON:  None Available. FINDINGS: Right Kidney: Renal measurements: 9.6 x 4.5 x 4.0 cm = volume: 91 mL. Echogenicity within normal limits. No mass or hydronephrosis visualized. Left Kidney: Renal measurements: 10.4 x 5.1 x 4.6 cm = volume: 128 mL. Echogenicity within normal limits. No mass or hydronephrosis visualized. Bladder: Appears normal for degree of bladder distention. Other: None. IMPRESSION: Normal renal ultrasound. Electronically Signed   By: Yvonne Kendall M.D.   On: 05/27/2022 16:29     Orders Placed This Encounter  Procedures  . Urine Culture  . Diet regular Room service appropriate? Yes; Fluid consistency: Thin  . Notify physician (specify)  . Vital signs  . Defer vaginal exam for vaginal bleeding or PROM <37 weeks  . Apply Antepartum Care Plan  . Initiate Oral Care Protocol  . Initiate Carrier Fluid Protocol  . Full code  . Type and screen [place]  . Place in  observation (patient's expected length of stay will be less than 2 midnights)   Meds ordered this encounter  Medications  . lactated ringers infusion  . acetaminophen (TYLENOL) tablet 650 mg  . docusate sodium (COLACE) capsule 100 mg  . calcium carbonate (TUMS - dosed in mg elemental calcium) chewable tablet 400 mg of elemental calcium  . prenatal multivitamin tablet 1 tablet  . ondansetron (ZOFRAN-ODT) disintegrating tablet 8 mg  . cefTRIAXone (ROCEPHIN) 2 g in sodium chloride 0.9 % 100 mL IVPB    Order Specific Question:   Antibiotic Indication:    Answer:   UTI   Assessment: 1. Pyelonephritis   2. Pregnancy, ectopic, cornual or cervical    Plan: Admit to [facility] for IV antibiotics Rocephin ordered per pharmacy recommendation given known anaphylaxis to PCN Dr. Mikel Cella awaiting consult from Dr. Kerin Perna, most likely plan is methotrexate dose with close specialist follow up  Gaylan Gerold, CNM, MSN, [place] Certified Nurse Midwife, [place]

## 2022-05-28 ENCOUNTER — Other Ambulatory Visit (HOSPITAL_COMMUNITY): Payer: Self-pay

## 2022-05-28 ENCOUNTER — Encounter (HOSPITAL_COMMUNITY): Payer: Self-pay | Admitting: Obstetrics and Gynecology

## 2022-05-28 DIAGNOSIS — D252 Subserosal leiomyoma of uterus: Secondary | ICD-10-CM | POA: Diagnosis present

## 2022-05-28 DIAGNOSIS — R1032 Left lower quadrant pain: Secondary | ICD-10-CM | POA: Diagnosis present

## 2022-05-28 DIAGNOSIS — D250 Submucous leiomyoma of uterus: Secondary | ICD-10-CM | POA: Diagnosis present

## 2022-05-28 DIAGNOSIS — O3411 Maternal care for benign tumor of corpus uteri, first trimester: Secondary | ICD-10-CM | POA: Diagnosis present

## 2022-05-28 DIAGNOSIS — O2301 Infections of kidney in pregnancy, first trimester: Secondary | ICD-10-CM | POA: Diagnosis present

## 2022-05-28 DIAGNOSIS — N12 Tubulo-interstitial nephritis, not specified as acute or chronic: Secondary | ICD-10-CM | POA: Diagnosis not present

## 2022-05-28 DIAGNOSIS — O008 Other ectopic pregnancy without intrauterine pregnancy: Secondary | ICD-10-CM

## 2022-05-28 DIAGNOSIS — Z3A01 Less than 8 weeks gestation of pregnancy: Secondary | ICD-10-CM | POA: Diagnosis not present

## 2022-05-28 DIAGNOSIS — Z880 Allergy status to penicillin: Secondary | ICD-10-CM | POA: Diagnosis not present

## 2022-05-28 LAB — TYPE AND SCREEN
ABO/RH(D): O POS
Antibody Screen: NEGATIVE

## 2022-05-28 MED ORDER — ACETAMINOPHEN 500 MG PO TABS
1000.0000 mg | ORAL_TABLET | Freq: Four times a day (QID) | ORAL | Status: DC | PRN
  Administered 2022-05-28 (×2): 1000 mg via ORAL
  Filled 2022-05-28 (×2): qty 2

## 2022-05-28 MED ORDER — METHOTREXATE FOR ECTOPIC PREGNANCY
50.0000 mg/m2 | Freq: Once | INTRAMUSCULAR | Status: AC
  Administered 2022-05-28: 102.5 mg via INTRAMUSCULAR
  Filled 2022-05-28: qty 4.1

## 2022-05-28 MED ORDER — SULFAMETHOXAZOLE-TRIMETHOPRIM 800-160 MG PO TABS
1.0000 | ORAL_TABLET | Freq: Two times a day (BID) | ORAL | 0 refills | Status: AC
  Filled 2022-05-28: qty 20, 10d supply, fill #0

## 2022-05-28 NOTE — Progress Notes (Signed)
FACULTY PRACTICE ANTEPARTUM COMPREHENSIVE PROGRESS NOTE  Nancy Davis is a 47 y.o. G2P1001 at 60w2dwith cervical ectopic pregnancy admitted for management of pyelonephritis and ectopic pregnancy  Length of Stay:  0 Days. Admitted 05/27/2022  Subjective: Feeling about the same this morning. Reports lower abdominal cramping pain and left sided back pain. Notes blood in her urine, but just minimal dark brown spotting on her pad.   Vitals:  Blood pressure 129/68, pulse 77, temperature 98 F (36.7 C), temperature source Oral, resp. rate 17, height '5\' 8"'$  (1.727 m), weight 88.5 kg, last menstrual period 04/07/2022, SpO2 99 %. Physical Examination: CONSTITUTIONAL: Well-developed, well-nourished female in no acute distress.  CARDIOVASCULAR: Normal heart rate noted, regular rhythm RESPIRATORY: Effort normal, no problems with respiration noted ABDOMEN: Soft, mild SP tenderness  Results for orders placed or performed during the hospital encounter of 05/27/22 (from the past 48 hour(s))  Type and screen [place]    Status: None   Collection Time: 05/28/22  5:27 AM  Result Value Ref Range   ABO/RH(D) O POS    Antibody Screen NEG    Sample Expiration      05/31/2022,2359 Performed at [facility] [address] [place] [address]   [place] Transvaginal  Result Date: 05/27/2022 CLINICAL DATA:  Pregnant patient with pelvic pain with previous ultrasound findings suspicious for failed pregnancy in progress versus cervical ectopic. EXAM: OBSTETRIC <14 WK [place] TRANSVAGINAL OB [place]QUE: Both transabdominal and transvaginal ultrasound examinations were performed for complete evaluation of the gestation as well as the maternal uterus, adnexal regions, and pelvic cul-de-sac. Transvaginal technique was performed to assess early pregnancy. COMPARISON:  Obstetric ultrasound dated 05/27/2022 FINDINGS: Intrauterine gestational sac: Single Yolk sac:  Visualized. Embryo:   Visualized. Cardiac Activity: Not Visualized. Heart Rate: N/A MSD: N/A CRL:  4.6 mm   6 w   1 d                  [place]: 01/19/2023 Subchorionic hemorrhage:  None visualized. Maternal uterus/adnexae: Again seen is a gestational sac containing a yolk sac and a fetal pole present within the cervix, located within the posterior cervical wall, separate from the endocervical canal. No gestational sac is visualized within the uterine body or fundus. Again seen are multiple uterine fibroids. The ovaries are not seen. No pelvic free fluid. IMPRESSION: Gestational sac containing a yolk sac and fetal pole centered within the posterior cervical wall, separate from the endocervical canal, favored to reflect a cervical ectopic pregnancy. These results were called by telephone at the time of interpretation on 05/27/2022 at 8:45 pm to provider JGaylan Gerold CNM, who verbally acknowledged these results. Electronically Signed   By: LDarrin NipperM.D.   On: 05/27/2022 20:49   [place] Comp < 14 Wks  Result Date: 05/27/2022 CLINICAL DATA:  Pregnant patient with pelvic and flank pain. EXAM: OBSTETRIC <14 WK [place] TRANSVAGINAL OB [place]QUE: Both transabdominal and transvaginal ultrasound examinations were performed for complete evaluation of the gestation as well as the maternal uterus, adnexal regions, and pelvic cul-de-sac. Transvaginal technique was performed to assess early pregnancy. COMPARISON:  Pelvic ultrasound 01/20/2022 FINDINGS: Intrauterine gestational sac: Single, present in cervix. Yolk sac:  Visualized. Embryo:  Possible but not definite. Cardiac Activity: Not Visualized. MSD: 10.7 mm   5 w   6 d Subchorionic hemorrhage:  None visualized. Maternal uterus/adnexae: Gestational sac containing a yolk sac within the cervix, probable  adjacent blood. Endometrium within the fundus of the uterus measures 10 mm. There are multiple uterine fibroids, including a 2.3 cm posterior subserosal fibroid and 2.5 cm potentially submucosal  fibroid. Neither ovary is definitively visualized. No adnexal mass. No pelvic free fluid. IMPRESSION: 1. Intrauterine gestational sac containing a yolk sac is present in the cervix. Differential considerations include failed pregnancy in progress versus cervical ectopic. Recommend trending of beta HCG and sonographic follow-up as indicated. 2. Uterine fibroids. 3. Neither ovary is visualized.  No adnexal mass These results were called by telephone at the time of interpretation on 05/27/2022 at 5:06 pm to provider [facility], who verbally acknowledged these results. Electronically Signed   By: Keith Rake M.D.   On: 05/27/2022 17:08   US OB Transvaginal  Result Date: 05/27/2022 CLINICAL DATA:  Pregnant patient with pelvic and flank pain. EXAM: OBSTETRIC <14 WK [place] AND TRANSVAGINAL OB US TECHNIQUE: Both transabdominal and transvaginal ultrasound examinations were performed for complete evaluation of the gestation as well as the maternal uterus, adnexal regions, and pelvic cul-de-sac. Transvaginal technique was performed to assess early pregnancy. COMPARISON:  Pelvic ultrasound 01/20/2022 FINDINGS: Intrauterine gestational sac: Single, present in cervix. Yolk sac:  Visualized. Embryo:  Possible but not definite. Cardiac Activity: Not Visualized. MSD: 10.7 mm   5 w   6 d Subchorionic hemorrhage:  None visualized. Maternal uterus/adnexae: Gestational sac containing a yolk sac within the cervix, probable adjacent blood. Endometrium within the fundus of the uterus measures 10 mm. There are multiple uterine fibroids, including a 2.3 cm posterior subserosal fibroid and 2.5 cm potentially submucosal fibroid. Neither ovary is definitively visualized. No adnexal mass. No pelvic free fluid. IMPRESSION: 1. Intrauterine gestational sac containing a yolk sac is present in the cervix. Differential considerations include failed pregnancy in progress versus cervical ectopic. Recommend trending of beta HCG and sonographic follow-up  as indicated. 2. Uterine fibroids. 3. Neither ovary is visualized.  No adnexal mass These results were called by telephone at the time of interpretation on 05/27/2022 at 5:06 pm to provider [facility], who verbally acknowledged these results. Electronically Signed   By: Keith Rake M.D.   On: 05/27/2022 17:08   US Renal  Result Date: 05/27/2022 CLINICAL DATA:  Flank pain. EXAM: RENAL / URINARY TRACT ULTRASOUND COMPLETE COMPARISON:  None Available. FINDINGS: Right Kidney: Renal measurements: 9.6 x 4.5 x 4.0 cm = volume: 91 mL. Echogenicity within normal limits. No mass or hydronephrosis visualized. Left Kidney: Renal measurements: 10.4 x 5.1 x 4.6 cm = volume: 128 mL. Echogenicity within normal limits. No mass or hydronephrosis visualized. Bladder: Appears normal for degree of bladder distention. Other: None. IMPRESSION: Normal renal ultrasound. Electronically Signed   By: Yvonne Kendall M.D.   On: 05/27/2022 16:29    Current scheduled medications  docusate sodium  100 mg Oral Daily   methotrexate (for ectopic pregnancy)  50 mg/m2 Intramuscular Once   prenatal multivitamin  1 tablet Oral Q1200   I have reviewed the patient's current medications.  ASSESSMENT: Principal Problem:   Pyelonephritis Active Problems:   Pregnancy, ectopic, cornual or cervical   PLAN: 46yo a/f pyelonephritis & cervical ectopic pregnancy   Pyelonephritis: - dx by UA +LE/nitrite & blood, +L CVA tenderness. No leukocytosis & renal US WNL. No e/o sepsis at this time. - CTX 2g q24h (3/8 - )   Cervical ectopic pregnancy: - MTX '50mg'$ /m2 3/8 AM (ordered) - D/w Dr. Kerin Perna, plan for formal consult this AM   Dispo: admit to [facility]  Gale Journey, MD [place], [facility] for [employer], [place]

## 2022-05-28 NOTE — Progress Notes (Signed)
This chaplain responded to the unit consult for creating/updating the Pt. Advance Directive. The chaplain reviewed the chart notes and was updated by the RN-Terri.  The chaplain understands the Pt. is sleeping at the time of the visit. The RN will give the Pt. the blank AD package for Pt. review. The chaplain understands the RN will page the spiritual care office if the Pt. Is interested in AD education.  Chaplain Sallyanne Kuster [phone]

## 2022-05-28 NOTE — Progress Notes (Signed)
Pt seen with discussion of plan of care.  She felt more comfortable continuing IV antibiotics overnight to treat her pyelonephritis with the knowledge she would be discharged on 05/29/22.  The patient has been given Dr. Charlett Lango phone number and will follow up with him regarding surveillance of the cervical ectopic as she has now been treated with methotrexate.  Pt will be discharged with bactrim DS for continued treatment of pyelonephritis due to an allergy to penicillin.  Lynnda Shields, MD

## 2022-05-29 ENCOUNTER — Encounter (HOSPITAL_COMMUNITY): Payer: Self-pay | Admitting: Obstetrics and Gynecology

## 2022-05-29 NOTE — Discharge Summary (Signed)
Physician Discharge Summary  Patient ID: Nancy Davis MRN: [id number] DOB/AGE: 1976/02/09 47 y.o.  Admit date: 05/27/2022 Discharge date: 05/29/2022  Admission Diagnoses: Pyelonephritis [N12] Cervical ectopic pregnancy  Discharge Diagnoses:  Principal Problem:   Pyelonephritis Active Problems:   Pregnancy, ectopic, cornual or cervical  Discharged Condition: good  Hospital Course:  Nancy Davis is a 47yo G2P1001 with cervical ectopic pregnancy (55w1dCRL, no cardiac activity) who was admitted for pyelonephritis and cervical ectopic.  Ms. JMacdermottinitially presented with abdominal & left sided back pain. Pt was unaware she was pregnant, but workup revealed +UPT with BhCG 1550 and cervical ectopic pregnancy measuring 636w1dithout cardiac activity. She also had a UA with +LE, nitrite & blood with left sided CVA tenderness concerning for pyelonephritis. She remained afebrile, hemodynamically stable without a leukocytosis. Renal [place] unremarkable. [facility]   While admitted, the patient remained afebrile and hemodynamically stable without evidence of sepsis. She received IV ceftriaxone x 48 hours with improvement in her symptoms. She will be discharged home with a 10 day course of bactrim. Her prescriptions were sent meds to beds.   Regarding her cervical ectopic pregnancy, Dr. YaKerin Pernaas consulted. He recommended IM MTX '50mg'$ /m2 with outpatient REI follow up in 1 week. The patient received methotrexate on 3/8 without complication. She remained stable with only scant vaginal spotting throughout her admission. She was provided with contact information for both Dr. YaKerin Pernand [facility].   Of note, patient reported difficulty coping and processing a pregnancy loss. Chaplaincy was consulted. She was able to identify good supports and a plan for managing  grief symptoms. She was also agreeable to behavioral health evaluation within a week. [facility]  Consults:  Dr. YaKerin PernaSignificant Diagnostic Studies:  Recent Results (from the past 2160 hour(s))  CBC with Differential     Status: Abnormal   Collection Time: 05/27/22 12:50 PM  Result Value Ref Range   WBC 8.5 4.0 - 10.5 K/uL   RBC 5.29 (H) 3.87 - 5.11 MIL/uL   Hemoglobin 11.1 (L) 12.0 - 15.0 g/dL   HCT 35.7 (L) 36.0 - 46.0 %   MCV 67.5 (L) 80.0 - 100.0 fL   MCH 21.0 (L) 26.0 - 34.0 pg   MCHC 31.1 30.0 - 36.0 g/dL   RDW 17.9 (H) 11.5 - 15.5 %   Platelets 241 150 - 400 K/uL    Comment: REPEATED TO VERIFY   nRBC 0.0 0.0 - 0.2 %   Neutrophils Relative % 59 %   Neutro Abs 5.0 1.7 - 7.7 K/uL   Lymphocytes Relative 33 %   Lymphs Abs 2.8 0.7 - 4.0 K/uL   Monocytes Relative 6 %   Monocytes Absolute 0.5 0.1 - 1.0 K/uL   Eosinophils Relative 2 %   Eosinophils Absolute 0.2 0.0 - 0.5 K/uL   Basophils Relative 0 %   Basophils Absolute 0.0 0.0 - 0.1 K/uL   WBC Morphology WHITE COUNT CONFIRMED ON SMEAR    RBC Morphology MORPHOLOGY UNREMARKABLE    Smear Review PLATELET COUNT CONFIRMED BY SMEAR    nRBC 0 0 /100 WBC   Abs Immature Granulocytes 0.00 0.00 - 0.07 K/uL    Comment: Performed at [facility] [place] [id number] metabolic panel     Status:  Abnormal   Collection Time: 05/27/22 12:50 PM  Result Value Ref Range   Sodium 136 135 - 145 mmol/L   Potassium 3.5 3.5 - 5.1 mmol/L   Chloride 102 98 - 111 mmol/L   CO2 24 22 - 32 mmol/L   Glucose, Bld 93 70 - 99 mg/dL    Comment: Glucose reference range applies only to samples taken after fasting for at least 8 hours.   BUN 9 6 - 20 mg/dL   Creatinine, Ser 0.73 0.44 - 1.00 mg/dL   Calcium 8.6 (L) 8.9 - 10.3 mg/dL   Total Protein 6.9 6.5 - 8.1 g/dL   Albumin 3.3 (L) 3.5 - 5.0 g/dL   AST 15 15 - 41 U/L   ALT 8 0 - 44 U/L    Alkaline Phosphatase 54 38 - 126 U/L   Total Bilirubin 0.7 0.3 - 1.2 mg/dL   GFR, Estimated >60 >60 mL/min    Comment: (NOTE) Calculated using the CKD-EPI Creatinine Equation (2021)    Anion gap 10 5 - 15    Comment: Performed at [facility], [place] [address], [place], [place] [address]  Lipase, blood     Status: None   Collection Time: 05/27/22 12:50 PM  Result Value Ref Range   Lipase 29 11 - 51 U/L    Comment: Performed at [facility], [place] [address] [place], [place] [address]  I-Stat beta hCG blood, ED     Status: Abnormal   Collection Time: 05/27/22  1:20 PM  Result Value Ref Range   I-stat hCG, quantitative 1,403.3 (H) <5 mIU/mL   Comment 3            Comment:   GEST. AGE      CONC.  (mIU/mL)   <=1 WEEK        5 - 50     2 WEEKS       50 - 500     3 WEEKS       100 - 10,000     4 WEEKS     1,000 - 30,000        FEMALE AND NON-PREGNANT FEMALE:     LESS THAN 5 mIU/mL   hCG, quantitative, pregnancy     Status: Abnormal   Collection Time: 05/27/22  1:40 PM  Result Value Ref Range   hCG, Beta Chain, Quant, S 1,550 (H) <5 mIU/mL    Comment:          GEST. AGE      CONC.  (mIU/mL)   <=1 WEEK        5 - 50     2 WEEKS       50 - 500     3 WEEKS       100 - 10,000     4 WEEKS     1,000 - 30,000     5 WEEKS     3,500 - 115,000   6-8 WEEKS     12,000 - 270,000    12 WEEKS     15,000 - 220,000        FEMALE AND NON-PREGNANT FEMALE:     LESS THAN 5 mIU/mL Performed at [facility], [place] [address] [place], [place] [address]   Urinalysis, Routine w reflex microscopic -Urine, Clean Catch     Status: Abnormal   Collection Time: 05/27/22  1:45 PM  Result Value Ref Range   Color, Urine RED (A) YELLOW  Comment: BIOCHEMICALS MAY BE AFFECTED BY COLOR   APPearance CLOUDY (A) CLEAR   Specific Gravity, Urine 1.025 1.005 - 1.030   pH 5.0 5.0 - 8.0   Glucose, UA NEGATIVE NEGATIVE mg/dL   Hgb urine dipstick LARGE (A) NEGATIVE   Bilirubin Urine NEGATIVE NEGATIVE    Ketones, ur NEGATIVE NEGATIVE mg/dL   Protein, ur >300 (A) NEGATIVE mg/dL   Nitrite POSITIVE (A) NEGATIVE   Leukocytes,Ua MODERATE (A) NEGATIVE    Comment: Performed at [facility] [address], [place], [place] [address]  Urinalysis, Microscopic (reflex)     Status: Abnormal   Collection Time: 05/27/22  1:45 PM  Result Value Ref Range   RBC / HPF >50 0 - 5 RBC/hpf   WBC, UA 21-50 0 - 5 WBC/hpf   Bacteria, UA FEW (A) NONE SEEN   Squamous Epithelial / HPF 0-5 0 - 5 /HPF   Mucus PRESENT     Comment: Performed at [facility], [place] [address] [place], [place] [address]  Culture, [place] Urine     Status: Abnormal (Preliminary result)   Collection Time: 05/27/22  1:45 PM   Specimen: OB Clean Catch; Urine  Result Value Ref Range   Specimen Description OB CLEAN CATCH    Special Requests      Normal Performed at [facility], [place] [address], [place], [place] [address]    Culture >=100,000 COLONIES/mL GRAM NEGATIVE RODS (A)    Report Status PENDING   ABO/Rh     Status: None   Collection Time: 05/27/22  6:24 PM  Result Value Ref Range   ABO/RH(D)      O POS Performed at [facility] [address], [place], [place] [address]   Type and screen [place]     Status: None   Collection Time: 05/28/22  5:27 AM  Result Value Ref Range   ABO/RH(D) O POS    Antibody Screen NEG    Sample Expiration      05/31/2022,2359 Performed at [facility], [place] [address] [place],  [address]    CLINICAL DATA:  Pregnant patient with pelvic pain with previous ultrasound findings suspicious for failed pregnancy in progress versus cervical ectopic.   EXAM: OBSTETRIC <14 WK [place] AND TRANSVAGINAL OB US   TECHNIQUE: Both transabdominal and transvaginal ultrasound examinations were performed for complete evaluation of the gestation as well as the maternal uterus, adnexal regions, and pelvic cul-de-sac. Transvaginal technique was performed to  assess early pregnancy.   COMPARISON:  Obstetric ultrasound dated 05/27/2022   FINDINGS: Intrauterine gestational sac: Single   Yolk sac:  Visualized.   Embryo:  Visualized.   Cardiac Activity: Not Visualized.   Heart Rate: N/A   MSD: N/A   CRL:  4.6 mm   6 w   1 d                  [place] EDC: 01/19/2023   Subchorionic hemorrhage:  None visualized.   Maternal uterus/adnexae: Again seen is a gestational sac containing a yolk sac and a fetal pole present within the cervix, located within the posterior cervical wall, separate from the endocervical canal. No gestational sac is visualized within the uterine body or fundus. Again seen are multiple uterine fibroids. The ovaries are not seen. No pelvic free fluid.   IMPRESSION: Gestational sac containing a yolk sac and fetal pole centered within the posterior cervical wall, separate from the endocervical  canal, favored to reflect a cervical ectopic pregnancy.   These results were called by telephone at the time of interpretation on 05/27/2022 at 8:45 pm to provider Gaylan Gerold, CNM, who verbally acknowledged these results.     Electronically Signed   By: Darrin Nipper M.D.   On: 05/27/2022 20:49  Narrative & Impression  CLINICAL DATA:  Flank pain.   EXAM: RENAL / URINARY TRACT ULTRASOUND COMPLETE   COMPARISON:  None Available.   FINDINGS: Right Kidney:   Renal measurements: 9.6 x 4.5 x 4.0 cm = volume: 91 mL. Echogenicity within normal limits. No mass or hydronephrosis visualized.   Left Kidney:   Renal measurements: 10.4 x 5.1 x 4.6 cm = volume: 128 mL. Echogenicity within normal limits. No mass or hydronephrosis visualized.   Bladder:   Appears normal for degree of bladder distention.   Other:   None.   IMPRESSION: Normal renal ultrasound.     Electronically Signed   By: Yvonne Kendall M.D.   On: 05/27/2022 16:29    Treatments: IV fluids, IV antibiotics, IM methotrexate  Discharge Exam: Blood  pressure (!) 143/60, pulse 79, temperature 98.3 F (36.8 C), temperature source Oral, resp. rate 18, height '5\' 8"'$  (1.727 m), weight 88.5 kg, last menstrual period 04/07/2022, SpO2 97 %. General appearance: alert, cooperative, appears stated age, and no distress Back: left paraspinal tenderness from thoracic to lumbar spine. Mild CVA tenderness Resp: normal respiratory effort Cardio: normal peripheral perfusion GI: soft, mildly tender in RIGHT lower quadrant without rebound tenderness or guarding, nondistended  Disposition: Discharge disposition: 01-Home or Self Care      Discharge Instructions     Call MD for:  severe uncontrolled pain   Complete by: As directed    Call MD for:  temperature >100.4   Complete by: As directed    Diet general   Complete by: As directed    Lifting restrictions   Complete by: As directed    Avoid lifting more than 20-25lbs until you have finished follow up for your ectopic pregnancy   May shower / Bathe   Complete by: As directed    Sexual Activity Restrictions   Complete by: As directed    Please avoid sexual intercourse or anything in the vagina until you have finished following up for your ectopic pregnancy      Allergies as of 05/29/2022       Reactions   Penicillins Anaphylaxis   Coconut (cocos Nucifera) Hives        Medication List     STOP taking these medications    methylPREDNISolone 4 MG Tbpk tablet Commonly known as: MEDROL DOSEPAK   Myfembree 40-1-0.5 MG Tabs Generic drug: Relugolix-Estradiol-Norethind       TAKE these medications    acetaminophen 500 MG tablet Commonly known as: TYLENOL Take 500 mg by mouth every 6 (six) hours as needed for mild pain or moderate pain.   amLODipine 10 MG tablet Commonly known as: NORVASC Take 1 tablet (10 mg total) by mouth daily.   CeleBREX 200 MG capsule Generic drug: celecoxib Take 200 mg by mouth 2 (two) times daily.   diclofenac Sodium 1 % Gel Commonly known as:  Voltaren Apply 2 g topically 4 (four) times daily.   ibuprofen 800 MG tablet Commonly known as: ADVIL Take 1 tablet (800 mg total) by mouth 3 (three) times daily.   lisinopril-hydrochlorothiazide 10-12.5 MG tablet Commonly known as: Zestoretic Take 1 tablet by mouth daily.   multivitamin with  minerals tablet Take 1 tablet by mouth daily. Woman   OVER THE COUNTER MEDICATION Take by mouth daily. Vitamin b 12 sl liquid   rosuvastatin 5 MG tablet Commonly known as: CRESTOR Take 1 tablet (5 mg total) by mouth daily.   sulfamethoxazole-trimethoprim 800-160 MG tablet Commonly known as: BACTRIM DS Take 1 tablet by mouth 2 (two) times daily for 10 days.   Visine 0.025-0.3 % ophthalmic solution Generic drug: naphazoline-pheniramine Place 1 drop into both eyes 4 (four) times daily as needed for eye irritation.        Follow-up Information     Governor Specking, MD Follow up on 06/02/2022.   Specialty: Obstetrics and Gynecology Why: Dr. Charlett Lango office will help you schedule a follow up appointment. Please call [phone] if you start having vaginal bleeding - this is Dr. Charlett Lango phone number. Contact information: [place]. [place] [address] [phone]         [facility] Follow up in 1 week(s).   Specialty: Obstetrics and Gynecology Why: An behavioral health specialist will be reaching out next week with resources related to pregnancy loss and mental health.  Please call this number if you have questions related to your hospitalization. Contact information: [place] [phone]                Signed: Inez Catalina 05/29/2022, 10:13 AM

## 2022-05-30 LAB — CULTURE, OB URINE
Culture: >=100000 — AB
Special Requests: NORMAL

## 2022-05-31 ENCOUNTER — Ambulatory Visit (HOSPITAL_BASED_OUTPATIENT_CLINIC_OR_DEPARTMENT_OTHER): Payer: Medicaid Other | Admitting: Family Medicine

## 2022-05-31 ENCOUNTER — Telehealth: Payer: Self-pay | Admitting: Clinical

## 2022-05-31 NOTE — Telephone Encounter (Signed)
Attempt call regarding referral; Left HIPPA-compliant message to call back Kyanne Rials from [facility] at  [phone] (Damonte Frieson's office).   

## 2022-06-01 ENCOUNTER — Ambulatory Visit: Payer: Medicaid Other | Admitting: Obstetrics

## 2022-06-03 ENCOUNTER — Ambulatory Visit (HOSPITAL_BASED_OUTPATIENT_CLINIC_OR_DEPARTMENT_OTHER): Payer: Medicaid Other | Admitting: Family Medicine

## 2022-06-03 ENCOUNTER — Encounter (HOSPITAL_BASED_OUTPATIENT_CLINIC_OR_DEPARTMENT_OTHER): Payer: Self-pay | Admitting: Family Medicine

## 2022-06-03 ENCOUNTER — Ambulatory Visit: Payer: Medicaid Other | Admitting: Radiology

## 2022-06-03 VITALS — BP 138/82 | HR 80 | Ht 68.0 in | Wt 198.8 lb

## 2022-06-03 DIAGNOSIS — N12 Tubulo-interstitial nephritis, not specified as acute or chronic: Secondary | ICD-10-CM

## 2022-06-03 LAB — POCT URINALYSIS DIPSTICK
Bilirubin, UA: NEGATIVE
Glucose, UA: NEGATIVE
Ketones, UA: NEGATIVE
Leukocytes, UA: NEGATIVE
Nitrite, UA: NEGATIVE
Protein, UA: NEGATIVE
Spec Grav, UA: >=1.03 — AB (ref 1.010–1.025)
Urobilinogen, UA: 0.2 E.U./dL
pH, UA: 5 (ref 5.0–8.0)

## 2022-06-03 LAB — CBC WITH DIFFERENTIAL/PLATELET
Basophils Absolute: 0.1 10*3/uL (ref 0.0–0.2)
Basos: 1 %
EOS (ABSOLUTE): 0.1 10*3/uL (ref 0.0–0.4)
Eos: 3 %
Hematocrit: 35.3 % (ref 34.0–46.6)
Hemoglobin: 10.4 g/dL — ABNORMAL LOW (ref 11.1–15.9)
Immature Grans (Abs): 0 10*3/uL (ref 0.0–0.1)
Immature Granulocytes: 0 %
Lymphocytes Absolute: 1.8 10*3/uL (ref 0.7–3.1)
Lymphs: 42 %
MCH: 20.6 pg — ABNORMAL LOW (ref 26.6–33.0)
MCHC: 29.5 g/dL — ABNORMAL LOW (ref 31.5–35.7)
MCV: 70 fL — ABNORMAL LOW (ref 79–97)
Monocytes Absolute: 0.4 10*3/uL (ref 0.1–0.9)
Monocytes: 10 %
Neutrophils Absolute: 1.9 10*3/uL (ref 1.4–7.0)
Neutrophils: 44 %
Platelets: 313 10*3/uL (ref 150–450)
RBC: 5.05 x10E6/uL (ref 3.77–5.28)
RDW: 17.3 % — ABNORMAL HIGH (ref 11.7–15.4)
WBC: 4.3 10*3/uL (ref 3.4–10.8)

## 2022-06-03 NOTE — Progress Notes (Signed)
New Patient Office Visit  Subjective    Patient ID: Nancy Davis, female    DOB: 08-22-1975  Age: 47 y.o. MRN: [id number]  CC: Patient seen in the ED last week for abdominal and left-sided back pain. Had ectopic pregnancy and was diagnosed with pyelonephritis. Currently taking Bactrim x10 days. Reports her CVA tenderness has improved but is still present. Also report urinary frequency still. Will repeat UA and most likely send for culture based on her symptoms still persisting. Will repeat CBC to assess for improvement of infection.    Patient reports that this has been "difficult" for her but she has a good support system. I ensured she had a referral placed to [facility] for therapy. Denies SI.  States has a follow-up appt with OBGYN on Monday 3/18.   Outpatient Encounter Medications as of 06/03/2022  Medication Sig   acetaminophen (TYLENOL) 500 MG tablet Take 500 mg by mouth every 6 (six) hours as needed for mild pain or moderate pain.   diclofenac Sodium (VOLTAREN) 1 % GEL Apply 2 g topically 4 (four) times daily.   ibuprofen (ADVIL) 800 MG tablet Take 1 tablet (800 mg total) by mouth 3 (three) times daily.   Multiple Vitamins-Minerals (MULTIVITAMIN WITH MINERALS) tablet Take 1 tablet by mouth daily. Woman   naphazoline-pheniramine (VISINE) 0.025-0.3 % ophthalmic solution Place 1 drop into both eyes 4 (four) times daily as needed for eye irritation.   OVER THE COUNTER MEDICATION Take by mouth daily. Vitamin b 12 sl liquid   sulfamethoxazole-trimethoprim (BACTRIM DS) 800-160 MG tablet Take 1 tablet by mouth 2 (two) times daily for 10 days.   [DISCONTINUED] amLODipine (NORVASC) 10 MG tablet Take 1 tablet (10 mg total) by mouth daily. (Patient not taking: Reported on 06/03/2022)   [DISCONTINUED] CELEBREX 200 MG capsule Take 200 mg by mouth 2 (two) times daily. (Patient not taking: Reported on 06/03/2022)   [DISCONTINUED] lisinopril-hydrochlorothiazide (ZESTORETIC) 10-12.5 MG tablet Take 1 tablet by  mouth daily. (Patient not taking: Reported on 06/03/2022)   [DISCONTINUED] rosuvastatin (CRESTOR) 5 MG tablet Take 1 tablet (5 mg total) by mouth daily. (Patient not taking: Reported on 06/03/2022)   No facility-administered encounter medications on file as of 06/03/2022.    Past Medical History:  Diagnosis Date   Anemia    yrs ago   Dislocation of left knee with medial meniscus tear    Fibroid    Hypertension    Knee pain, left    from old injury playing sports in high school,     Past Surgical History:  Procedure Laterality Date   CESAREAN SECTION  2007   KNEE ARTHROSCOPY WITH MEDIAL MENISECTOMY Right 01/01/2020   Procedure: KNEE ARTHROSCOPY WITH MEDIAL MENISECTOMY and chrondroplasty;  Surgeon: Renette Butters, MD;  Location: [facility];  Service: Orthopedics;  Laterality: Right;   SVT ABLATION N/A 10/27/2020   Procedure: SVT ABLATION;  Surgeon: Evans Lance, MD;  Location: [facility];  Service: Cardiovascular;  Laterality: N/A;    Family History  Problem Relation Age of Onset   Heart disease Mother    Thyroid disease Mother    Osteoarthritis Mother    Prostate cancer Father    Thyroid disease Sister    Breast cancer Sister        under 29 at onset   Breast cancer Maternal Aunt        over 67 at onset   Osteoarthritis Maternal Aunt    Osteoarthritis Maternal Grandmother  Breast cancer Paternal Grandmother        over 67 at onset    Social History   Socioeconomic History   Marital status: Single    Spouse name: Not on file   Number of children: Not on file   Years of education: Not on file   Highest education level: Not on file  Occupational History   Not on file  Tobacco Use   Smoking status: Never    Passive exposure: Never   Smokeless tobacco: Never  Vaping Use   Vaping Use: Never used  Substance and Sexual Activity   Alcohol use: No   Drug use: No   Sexual activity: Yes    Partners: Male    Birth control/protection: None,  Post-menopausal  Other Topics Concern   Not on file  Social History Narrative   Not on file   Social Determinants of Health   Financial Resource Strain: Not on file  Food Insecurity: No Food Insecurity (05/28/2022)   Hunger Vital Sign    Worried About Running Out of Food in the Last Year: Never true    Ran Out of Food in the Last Year: Never true  Transportation Needs: No Transportation Needs (05/28/2022)   PRAPARE - Hydrologist (Medical): No    Lack of Transportation (Non-Medical): No  Physical Activity: Not on file  Stress: Not on file  Social Connections: Not on file  Intimate Partner Violence: Not At Risk (05/28/2022)   Humiliation, Afraid, Rape, and Kick questionnaire    Fear of Current or Ex-Partner: No    Emotionally Abused: No    Physically Abused: No    Sexually Abused: No    Review of Systems  Constitutional:  Negative for chills and fever.  Respiratory:  Negative for cough and hemoptysis.   Cardiovascular:  Negative for chest pain and palpitations.  Gastrointestinal:  Negative for abdominal pain, nausea and vomiting.  Genitourinary:  Positive for flank pain, frequency and urgency. Negative for dysuria.  Musculoskeletal:  Negative for myalgias.  Neurological:  Negative for dizziness and headaches.  Psychiatric/Behavioral:  Positive for depression. Negative for suicidal ideas.       Objective    BP 138/82   Pulse 80   Ht '5\' 8"'$  (1.727 m)   Wt 198 lb 12.8 oz (90.2 kg)   LMP 04/07/2022 Comment: End of January  SpO2 100%   BMI 30.23 kg/m   Physical Exam Constitutional:      Appearance: Normal appearance. She is normal weight.  Cardiovascular:     Rate and Rhythm: Normal rate and regular rhythm.     Heart sounds: Normal heart sounds.  Pulmonary:     Effort: Pulmonary effort is normal.     Breath sounds: Normal breath sounds.  Abdominal:     Tenderness: There is abdominal tenderness (Right lower sided tendernes without rebound).  There is right CVA tenderness ("mild" pain). There is no left CVA tenderness.  Musculoskeletal:        General: Normal range of motion.  Neurological:     Mental Status: She is alert.  Psychiatric:        Behavior: Behavior normal.        Thought Content: Thought content normal.     Assessment & Plan:   Problem List Items Addressed This Visit       Genitourinary   Pyelonephritis - Primary    Patient reports going to the ED last week for abdominal and left-sided flank  pain. Diagnosed with pyelonephritis affecting pregnancy. Received IV ceftriaxone in the hospital and was prescribed 10-day course of Bactrim. Reports having a few days left. Physical exam with RLQ tenderness without rebound tenderness or radiation and mild right-sided flank pain. Patient also reports urinary frequency and urgency. Denies hematuria, fever, chills, body aches, dysuria, foul-smelling urine, and increased in back and abdominal pain. Hemodynamically stable with no signs of sepsis. UA was non-remarkable. Based on continued symptoms, will send urine for culture to ensure antibiotic. Advised pt if she has an increase in flank pain, abdominal pain, fever, chills/body aches, or worsening of symptoms to call the office back or seek emergency care.       Relevant Orders   POCT Urinalysis Dipstick (Completed)   CBC with Differential/Platelet   Urine Culture    Return if symptoms worsen or fail to improve.   Les Pou, FNP

## 2022-06-03 NOTE — Assessment & Plan Note (Signed)
Patient reports going to the ED last week for abdominal and left-sided flank pain. Diagnosed with pyelonephritis affecting pregnancy. Received IV ceftriaxone in the hospital and was prescribed 10-day course of Bactrim. Reports having a few days left. Physical exam with RLQ tenderness without rebound tenderness or radiation and mild right-sided flank pain. Patient also reports urinary frequency and urgency. Denies hematuria, fever, chills, body aches, dysuria, foul-smelling urine, and increased in back and abdominal pain. Hemodynamically stable with no signs of sepsis. UA was non-remarkable. Based on continued symptoms, will send urine for culture to ensure antibiotic. Advised pt if she has an increase in flank pain, abdominal pain, fever, chills/body aches, or worsening of symptoms to call the office back or seek emergency care.

## 2022-06-05 LAB — URINE CULTURE

## 2022-06-15 ENCOUNTER — Ambulatory Visit: Payer: Medicaid Other | Admitting: Radiology

## 2022-07-02 ENCOUNTER — Emergency Department (HOSPITAL_COMMUNITY): Payer: Medicaid Other

## 2022-07-02 ENCOUNTER — Emergency Department (HOSPITAL_COMMUNITY)
Admission: EM | Admit: 2022-07-02 | Discharge: 2022-07-02 | Disposition: A | Discharge status: Home / Self Care | Payer: Medicaid Other | Attending: Emergency Medicine | Admitting: Emergency Medicine

## 2022-07-02 ENCOUNTER — Encounter (HOSPITAL_COMMUNITY): Payer: Self-pay

## 2022-07-02 ENCOUNTER — Other Ambulatory Visit: Payer: Self-pay

## 2022-07-02 DIAGNOSIS — M25562 Pain in left knee: Secondary | ICD-10-CM | POA: Diagnosis present

## 2022-07-02 DIAGNOSIS — S8992XA Unspecified injury of left lower leg, initial encounter: Secondary | ICD-10-CM

## 2022-07-02 DIAGNOSIS — X501XXA Overexertion from prolonged static or awkward postures, initial encounter: Secondary | ICD-10-CM | POA: Diagnosis not present

## 2022-07-02 DIAGNOSIS — Y990 Civilian activity done for income or pay: Secondary | ICD-10-CM | POA: Diagnosis not present

## 2022-07-02 NOTE — ED Provider Triage Note (Signed)
Emergency Medicine Provider Triage Evaluation Note  Nancy Davis , a 47 y.o. female  was evaluated in triage.  Pt complains of left knee popping while walking yesterday.  She iced and elevated it last night but continues to have pain today.  Walking with a limp.  No history of injury to this knee  Review of Systems  Positive:  Negative:   Physical Exam  BP (!) 156/89   Pulse 66   Temp 98.2 F (36.8 C)   Resp 16   LMP 04/07/2022 Comment: End of January  SpO2 99%  Gen:   Awake, no distress   Resp:  Normal effort  MSK:   Moves extremities without difficulty  Other:  Normal exam  Medical Decision Making  Medically screening exam initiated at 1:09 PM.  Appropriate orders placed.  Nancy Davis was informed that the remainder of the evaluation will be completed by another provider, this initial triage assessment does not replace that evaluation, and the importance of remaining in the ED until their evaluation is complete.     Saddie Benders, PA-C 07/02/22 1309

## 2022-07-02 NOTE — ED Notes (Signed)
Ortho came to size crutches but patient stated she did not need them.

## 2022-07-02 NOTE — ED Triage Notes (Signed)
Pt came in via POV d/t her Lt knee that she said "popped" out yesterday around 1500. A/Ox4, rates pain 10/10 while in triage. Endorses difficult to walk on it.

## 2022-07-02 NOTE — Progress Notes (Signed)
Orthopedic Tech Progress Note Patient Details:  Nancy Davis 12-19-1975 [id number]  Ortho Devices Type of Ortho Device: Knee Immobilizer Ortho Device/Splint Location: LLE Ortho Device/Splint Interventions: Ordered, Application, Adjustment   Post Interventions Patient Tolerated: Well  Al Decant 07/02/2022, 7:17 PM

## 2022-07-02 NOTE — ED Provider Notes (Signed)
[place] EMERGENCY DEPARTMENT AT [facility] Provider Note   CSN: [id number] Arrival date & time: 07/02/22  1243     History  Chief Complaint  Patient presents with   Lt Knee Pain    Nancy Davis is a 47 y.o. female.  Patient is a 47 year old female who presents with pain in her left knee.  She was walking around a corner at work and twisted her knee and felt a pop on the medial aspect of the knee.  She says been sore since that time.  She denies any other injuries.  No prior injuries to the knee.       Home Medications Prior to Admission medications   Medication Sig Start Date End Date Taking? Authorizing Provider  acetaminophen (TYLENOL) 500 MG tablet Take 500 mg by mouth every 6 (six) hours as needed for mild pain or moderate pain.    [provider]  diclofenac Sodium (VOLTAREN) 1 % GEL Apply 2 g topically 4 (four) times daily. 06/20/21   Cristopher Peru, PA-C  ibuprofen (ADVIL) 800 MG tablet Take 1 tablet (800 mg total) by mouth 3 (three) times daily. 10/16/21   Theron Arista, PA-C  Multiple Vitamins-Minerals (MULTIVITAMIN WITH MINERALS) tablet Take 1 tablet by mouth daily. Woman    [provider]  naphazoline-pheniramine (VISINE) 0.025-0.3 % ophthalmic solution Place 1 drop into both eyes 4 (four) times daily as needed for eye irritation.    [provider]  OVER THE COUNTER MEDICATION Take by mouth daily. Vitamin b 12 sl liquid    [provider]      Allergies    Penicillins and Coconut (cocos nucifera)    Review of Systems   Review of Systems  Constitutional:  Negative for fever.  Gastrointestinal:  Negative for nausea and vomiting.  Musculoskeletal:  Positive for arthralgias and joint swelling. Negative for back pain and neck pain.  Skin:  Negative for wound.  Neurological:  Negative for weakness, numbness and headaches.    Physical Exam Updated Vital Signs BP (!) 142/80   Pulse 69   Temp 98.1 F (36.7 C)  (Oral)   Resp 14   Ht  (1.727 m)   Wt 87.1 kg   LMP 04/07/2022 Comment: End of January  SpO2 98%   BMI 29.19 kg/m  Physical Exam Constitutional:      Appearance: She is well-developed.  HENT:     Head: Normocephalic and atraumatic.  Cardiovascular:     Rate and Rhythm: Normal rate.  Pulmonary:     Effort: Pulmonary effort is normal.  Musculoskeletal:        General: Tenderness present.     Cervical back: Normal range of motion and neck supple.     Comments: There is tenderness to the medial aspect of the left knee.  Some mild swelling to the area.  No effusions.  No gross joint instability.  She is neurovascular intact distally.  No wounds.  Skin:    General: Skin is warm and dry.  Neurological:     Mental Status: She is alert and oriented to person, place, and time.     ED Results / Procedures / Treatments   Labs (all labs ordered are listed, but only abnormal results are displayed) Labs Reviewed - No data to display  EKG None  Radiology DG Knee Complete 4 Views Left  Result Date: 07/02/2022 CLINICAL DATA:  Reason for exam: pain Per triage: Pt came in via POV d/t  her Lt knee that she said "popped" out yesterday around 1500. A/Ox4, rates pain 10/10 while in triage. Endorses difficult to walk on it. pain EXAM: LEFT KNEE - COMPLETE 4+ VIEW COMPARISON:  None Available. FINDINGS: No fracture of the proximal tibia or distal femur. Patella is normal. No joint effusion. IMPRESSION: No fracture or dislocation. Electronically Signed   By: Genevive Bi M.D.   On: 07/02/2022 13:52    Procedures Procedures    Medications Ordered in ED Medications - No data to display  ED Course/ Medical Decision Making/ A&P                             Medical Decision Making  Patient is a 47 year old female who presents with left knee pain.  X-rays were obtained which were interpreted by me and confirmed by the radiologist to show no fracture.  No gross ligament instability.  She is  placed in a knee immobilizer.  She was advised to take it out of the immobilizer periodically and move her leg around to help prevent DVTs.  She was advised on symptomatic care.  She was given a referral to follow-up with orthopedics.  Return precautions were given.  Final Clinical Impression(s) / ED Diagnoses Final diagnoses:  Injury of left knee, initial encounter    Rx / DC Orders ED Discharge Orders     None         Rolan Bucco, MD 07/02/22 2004

## 2022-07-14 ENCOUNTER — Ambulatory Visit: Payer: Medicaid Other | Admitting: Family Medicine

## 2022-09-25 ENCOUNTER — Emergency Department (HOSPITAL_COMMUNITY): Payer: Medicaid Other

## 2022-09-25 ENCOUNTER — Encounter (HOSPITAL_COMMUNITY): Payer: Self-pay | Admitting: Emergency Medicine

## 2022-09-25 ENCOUNTER — Other Ambulatory Visit: Payer: Self-pay

## 2022-09-25 ENCOUNTER — Emergency Department (HOSPITAL_COMMUNITY)
Admission: EM | Admit: 2022-09-25 | Discharge: 2022-09-25 | Disposition: A | Discharge status: Home / Self Care | Payer: Medicaid Other | Attending: Emergency Medicine | Admitting: Emergency Medicine

## 2022-09-25 DIAGNOSIS — M25562 Pain in left knee: Secondary | ICD-10-CM | POA: Diagnosis present

## 2022-09-25 DIAGNOSIS — X501XXA Overexertion from prolonged static or awkward postures, initial encounter: Secondary | ICD-10-CM | POA: Insufficient documentation

## 2022-09-25 MED ORDER — HYDROCODONE-ACETAMINOPHEN 5-325 MG PO TABS: 1.0000 | ORAL_TABLET | Freq: Four times a day (QID) | ORAL | 0 refills | Status: AC | PRN

## 2022-09-25 MED ORDER — HYDROCODONE-ACETAMINOPHEN 5-325 MG PO TABS
1.0000 | ORAL_TABLET | Freq: Once | ORAL | Status: AC
  Administered 2022-09-25: 1 via ORAL
  Filled 2022-09-25: qty 1

## 2022-09-25 NOTE — Progress Notes (Signed)
Orthopedic Tech Progress Note Patient Details:  Nancy Davis July 08, 1975 [id number]  Applied knee immobilizer. Pt stated that she did not need crutches and she was fine to walk. Ortho Devices Type of Ortho Device: Knee Immobilizer Ortho Device/Splint Location: LLE Ortho Device/Splint Interventions: Ordered, Application, Adjustment   Post Interventions Patient Tolerated: Well Instructions Provided: Care of device, Adjustment of device  Sherilyn Banker 09/25/2022, 5:49 PM

## 2022-09-25 NOTE — ED Notes (Signed)
Ortho called 

## 2022-09-25 NOTE — ED Notes (Signed)
Dc instructions reviewed with pt. PT verbalized understanding. PT DC 

## 2022-09-25 NOTE — ED Provider Notes (Signed)
[place] EMERGENCY DEPARTMENT AT [facility] Provider Note   CSN: [id number] Arrival date & time: 09/25/22  1449     History  Chief Complaint  Patient presents with   Knee Pain    Nancy Davis is a 47 y.o. female.  47 year old female with prior medical history as detailed below presents for evaluation.  Patient complains of left knee pain.  Patient reports that she was walking down the steps that her parents basement on 4 July.  She felt a pop.  Since this incident she developed pain and swelling to the left knee.  She tried icing, elevating, and taking ibuprofen at home.  She reports that she is on her feet at work.  Today she presents from work secondary to continued pain.  She denies fall.  She denies fever.  She denies prior injury to the knee itself.  She has an appointment on Tuesday morning with her already established outpatient orthopedic care provider for evaluation of her left knee pain.  The history is provided by the patient and medical records.       Home Medications Prior to Admission medications   Medication Sig Start Date End Date Taking? Authorizing Provider  HYDROcodone-acetaminophen (NORCO/VICODIN) 5-325 MG tablet Take 1 tablet by mouth every 6 (six) hours as needed. 09/25/22  Yes Wynetta Fines, MD  acetaminophen (TYLENOL) 500 MG tablet Take 500 mg by mouth every 6 (six) hours as needed for mild pain or moderate pain.    [provider]  diclofenac Sodium (VOLTAREN) 1 % GEL Apply 2 g topically 4 (four) times daily. 06/20/21   Cristopher Peru, PA-C  ibuprofen (ADVIL) 800 MG tablet Take 1 tablet (800 mg total) by mouth 3 (three) times daily. 10/16/21   Theron Arista, PA-C  Multiple Vitamins-Minerals (MULTIVITAMIN WITH MINERALS) tablet Take 1 tablet by mouth daily. Woman    [provider]  naphazoline-pheniramine (VISINE) 0.025-0.3 % ophthalmic solution Place 1 drop into both eyes 4 (four) times daily as needed for eye irritation.     [provider]  OVER THE COUNTER MEDICATION Take by mouth daily. Vitamin b 12 sl liquid    [provider]      Allergies    Penicillins and Coconut (cocos nucifera)    Review of Systems   Review of Systems  All other systems reviewed and are negative.   Physical Exam Updated Vital Signs BP (!) 148/96   Pulse 72   Temp 98.2 F (36.8 C) (Oral)   Resp 16   LMP  (LMP Unknown)   SpO2 100%  Physical Exam Vitals and nursing note reviewed.  Constitutional:      General: She is not in acute distress.    Appearance: Normal appearance. She is well-developed.  HENT:     Head: Normocephalic and atraumatic.  Eyes:     Conjunctiva/sclera: Conjunctivae normal.     Pupils: Pupils are equal, round, and reactive to light.  Cardiovascular:     Rate and Rhythm: Normal rate and regular rhythm.     Heart sounds: Normal heart sounds.  Pulmonary:     Effort: Pulmonary effort is normal. No respiratory distress.     Breath sounds: Normal breath sounds.  Abdominal:     General: There is no distension.     Palpations: Abdomen is soft.     Tenderness: There is no abdominal tenderness.  Musculoskeletal:        General: Tenderness present. No deformity. Normal range of  motion.     Cervical back: Normal range of motion and neck supple.     Comments: Diffusely tender to the anterior aspect of the left knee.  Mild effusion appreciated.  No overlying erythema or significant edema.  Patient with stable knee joint.  Distal left lower extremity is neurovascular intact.  Skin:    General: Skin is warm and dry.  Neurological:     General: No focal deficit present.     Mental Status: She is alert and oriented to person, place, and time.     ED Results / Procedures / Treatments   Labs (all labs ordered are listed, but only abnormal results are displayed) Labs Reviewed - No data to display  EKG None  Radiology DG Knee Complete 4 Views Left  Result Date: 09/25/2022 CLINICAL  DATA:  Pain after a fall. EXAM: LEFT KNEE - COMPLETE 4+ VIEW COMPARISON:  03/01/2018 FINDINGS: No evidence for an acute fracture. No subluxation or dislocation. No worrisome lytic or sclerotic osseous abnormality. Degenerative spurring is noted in all 3 compartments with small joint effusion evident. 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 Electronically Signed   By: Kennith Center M.D.   On: 09/25/2022 17:12    Procedures Procedures    Medications Ordered in ED Medications  HYDROcodone-acetaminophen (NORCO/VICODIN) 5-325 MG per tablet 1 tablet (has no administration in time range)    ED Course/ Medical Decision Making/ A&P                             Medical Decision Making Risk Prescription drug management.    Medical Screen Complete  This patient presented to the ED with complaint of left knee pain.  This complaint involves an extensive number of treatment options. The initial differential diagnosis includes, but is not limited to, left knee strain, fracture, contusion, etc.  This presentation is: Acute, Chronic, Self-Limited, Previously Undiagnosed, Uncertain Prognosis, Complicated, Systemic Symptoms, and Threat to Life/Bodily Function  Patient is presenting with complaint of left knee pain after misstep and injury 2 days prior.  Patient's x-ray is without evidence of acute fracture.  Patient does have tricompartmental degenerative changes on x-ray.  Mild effusion appreciated on exam.  Patient would benefit from prescription strength pain medication, knee immobilizer, crutches.  Patient has already arranged outpatient orthopedic follow-up for Tuesday morning.  She understands the need to closely follow-up with her already established outpatient orthopedic provider.  Strict return precautions given and understood.  Importance of close follow-up is stressed.   Additional history obtained:  External records from outside sources obtained  and reviewed including prior ED visits and prior Inpatient records.    Imaging Studies ordered:  I ordered imaging studies including plain films of the left knee I independently visualized and interpreted obtained imaging which showed no acute fracture I agree with the radiologist interpretation.  Medicines ordered:  I ordered medication including Norco for pain Reevaluation of the patient after these medicines showed that the patient: improved  Problem List / ED Course:  Left knee pain   Reevaluation:  After the interventions noted above, I reevaluated the patient and found that they have: improved  Disposition:  After consideration of the diagnostic results and the patients response to treatment, I feel that the patent would benefit from close outpatient follow-up.          Final Clinical Impression(s) / ED Diagnoses Final diagnoses:  Acute pain of left knee  Rx / DC Orders ED Discharge Orders          Ordered    HYDROcodone-acetaminophen (NORCO/VICODIN) 5-325 MG tablet  Every 6 hours PRN        09/25/22 1715              Wynetta Fines, MD 09/25/22 1720

## 2022-09-25 NOTE — Discharge Instructions (Addendum)
Return for any problem.  ?

## 2022-09-25 NOTE — ED Provider Triage Note (Signed)
Emergency Medicine Provider Triage Evaluation Note  Nancy Davis , a 47 y.o. female  was evaluated in triage.  Pt complains of left knee pain.  She was walking down the steps of her parents basement 2 days ago when she fell and felt a pop.  She has had increasing swelling and pain to the left knee.  She tried icing, elevating and taking Tylenol yesterday with minimal improvement.  She denies any numbness or tingling.  Pain is localized mostly to the medial aspect of the left knee  Review of Systems  Positive: As above Negative: As above  Physical Exam  BP (!) 148/96   Pulse 72   Temp 98.2 F (36.8 C) (Oral)   Resp 16   SpO2 100%  Gen:   Awake, no distress   Resp:  Normal effort  MSK:   Moves extremities without difficulty Other:  Moderate swelling to the left knee.  Significant TTP.  Negative anterior and posterior drawer test.  Negative varus and valgus stress test.  Compartments are soft.  Sensation intact distal to the knee  Medical Decision Making  Medically screening exam initiated at 4:12 PM.  Appropriate orders placed.  Nancy Davis was informed that the remainder of the evaluation will be completed by another provider, this initial triage assessment does not replace that evaluation, and the importance of remaining in the ED until their evaluation is complete.  Imaging ordered   Michelle Piper, Cordelia Poche 09/25/22 1610

## 2022-09-25 NOTE — ED Triage Notes (Signed)
Pt reports on July 4 she was going down steps and felt a pop in her left knee.  Has had swelling. Has tried ice/elevation/and ibuprofen without relief. Today when she got up to go to work this morning she was in more pain.  Hx of knee issues she states since running track in high school.  Swelling noted to left knee.

## 2023-01-23 ENCOUNTER — Other Ambulatory Visit: Payer: Self-pay

## 2023-01-23 ENCOUNTER — Emergency Department (HOSPITAL_COMMUNITY)
Admission: EM | Admit: 2023-01-23 | Discharge: 2023-01-23 | Disposition: A | Discharge status: Home / Self Care | Payer: Medicaid Other | Attending: Emergency Medicine | Admitting: Emergency Medicine

## 2023-01-23 ENCOUNTER — Encounter (HOSPITAL_COMMUNITY): Payer: Self-pay

## 2023-01-23 DIAGNOSIS — W260XXA Contact with knife, initial encounter: Secondary | ICD-10-CM | POA: Diagnosis not present

## 2023-01-23 DIAGNOSIS — Z23 Encounter for immunization: Secondary | ICD-10-CM | POA: Diagnosis not present

## 2023-01-23 DIAGNOSIS — S61211A Laceration without foreign body of left index finger without damage to nail, initial encounter: Secondary | ICD-10-CM | POA: Diagnosis present

## 2023-01-23 DIAGNOSIS — Y93G1 Activity, food preparation and clean up: Secondary | ICD-10-CM | POA: Insufficient documentation

## 2023-01-23 DIAGNOSIS — S61311A Laceration without foreign body of left index finger with damage to nail, initial encounter: Secondary | ICD-10-CM

## 2023-01-23 MED ORDER — TETANUS-DIPHTH-ACELL PERTUSSIS 5-2.5-18.5 LF-MCG/0.5 IM SUSY
0.5000 mL | PREFILLED_SYRINGE | Freq: Once | INTRAMUSCULAR | Status: AC
  Administered 2023-01-23: 0.5 mL via INTRAMUSCULAR
  Filled 2023-01-23: qty 0.5

## 2023-01-23 NOTE — ED Triage Notes (Signed)
Pt arrived with lac to 2nd digit on left hand. Reports she was washing dishes and didn't realize knife in the sink. Bleeding controlled at this time.

## 2023-01-23 NOTE — Discharge Instructions (Addendum)
Keep area clean, dry.  Return for any new or worsening symptoms

## 2023-01-23 NOTE — ED Provider Notes (Signed)
[place] EMERGENCY DEPARTMENT AT [facility] Provider Note   CSN: [id number] Arrival date & time: 01/23/23  1615     History  Chief Complaint  Patient presents with   Laceration    Nancy Davis is a 47 y.o. female for evaluation of laceration.  Laceration to second digit on left upper extremity.  She was washing dishes did not realize there was a knife in the sink.  Unknown last tetanus.  No bony tenderness, numbness or weakness.  No nailbed damage.  HPI     Home Medications Prior to Admission medications   Medication Sig Start Date End Date Taking? Authorizing Provider  acetaminophen (TYLENOL) 500 MG tablet Take 500 mg by mouth every 6 (six) hours as needed for mild pain or moderate pain.    [provider]  diclofenac Sodium (VOLTAREN) 1 % GEL Apply 2 g topically 4 (four) times daily. 06/20/21   Cristopher Peru, PA-C  HYDROcodone-acetaminophen (NORCO/VICODIN) 5-325 MG tablet Take 1 tablet by mouth every 6 (six) hours as needed. 09/25/22   Wynetta Fines, MD  ibuprofen (ADVIL) 800 MG tablet Take 1 tablet (800 mg total) by mouth 3 (three) times daily. 10/16/21   Theron Arista, PA-C  Multiple Vitamins-Minerals (MULTIVITAMIN WITH MINERALS) tablet Take 1 tablet by mouth daily. Woman    [provider]  naphazoline-pheniramine (VISINE) 0.025-0.3 % ophthalmic solution Place 1 drop into both eyes 4 (four) times daily as needed for eye irritation.    [provider]  OVER THE COUNTER MEDICATION Take by mouth daily. Vitamin b 12 sl liquid    [provider]      Allergies    Penicillins and Coconut (cocos nucifera)    Review of Systems   Review of Systems  Constitutional: Negative.   HENT: Negative.    Respiratory: Negative.    Cardiovascular: Negative.   Gastrointestinal: Negative.   Genitourinary: Negative.   Skin:  Positive for wound.  Neurological: Negative.   All other systems reviewed and are negative.   Physical  Exam Updated Vital Signs BP (!) 158/91 (BP Location: Right Arm)   Pulse 75   Temp 98.2 F (36.8 C) (Oral)   Resp 18   Ht 5\' 7"  (1.702 m)   Wt 90.3 kg   LMP 01/04/2023 (Approximate)   SpO2 100%   BMI 31.17 kg/m  Physical Exam Vitals and nursing note reviewed.  Constitutional:      General: She is not in acute distress.    Appearance: She is well-developed. She is not ill-appearing, toxic-appearing or diaphoretic.  HENT:     Head: Atraumatic.  Eyes:     Pupils: Pupils are equal, round, and reactive to light.  Cardiovascular:     Rate and Rhythm: Normal rate.     Pulses: Normal pulses.          Radial pulses are 2+ on the right side and 2+ on the left side.     Heart sounds: Normal heart sounds.  Pulmonary:     Effort: Pulmonary effort is normal. No respiratory distress.     Breath sounds: Normal breath sounds.  Abdominal:     General: There is no distension.  Musculoskeletal:        General: Normal range of motion.     Cervical back: Normal range of motion.     Comments: No bony tenderness, compartments soft, full range of motion without difficulty  Skin:    General: Skin is warm and dry.  Comments: Stellate 5 mm laceration to second digit on left upper extremity at radial aspect nail border no active bleeding, drainage  Neurological:     General: No focal deficit present.     Mental Status: She is alert.  Psychiatric:        Mood and Affect: Mood normal.     ED Results / Procedures / Treatments   Labs (all labs ordered are listed, but only abnormal results are displayed) Labs Reviewed - No data to display  EKG None  Radiology No results found.  Procedures Procedures    Medications Ordered in ED Medications  Tdap (BOOSTRIX) injection 0.5 mL (0.5 mLs Intramuscular Given 01/23/23 2046)   ED Course/ Medical Decision Making/ A&P   47 year old here for evaluation after sustaining laceration to second digit left upper extremity.  No nailbed abnormality,  subungual hematoma.  She is neurovascularly intact.  No bony tenderness.  Unsure last tetanus, will update.  Do not think patient needs labs or imaging at this time.  She has stellate 5 mm laceration which appears to be superficial.  Does not need closure at this time.  Her tetanus was updated.  Her wound was dressed.  Will have her follow-up outpatient, return for any worsening symptoms  The patient has been appropriately medically screened and/or stabilized in the ED. I have low suspicion for any other emergent medical condition which would require further screening, evaluation or treatment in the ED or require inpatient management.  Patient is hemodynamically stable and in no acute distress.  Patient able to ambulate in department prior to ED.  Evaluation does not show acute pathology that would require ongoing or additional emergent interventions while in the emergency department or further inpatient treatment.  I have discussed the diagnosis with the patient and answered all questions.  Pain is been managed while in the emergency department and patient has no further complaints prior to discharge.  Patient is comfortable with plan discussed in room and is stable for discharge at this time.  I have discussed strict return precautions for returning to the emergency department.  Patient was encouraged to follow-up with PCP/specialist refer to at discharge.                                  Medical Decision Making Amount and/or Complexity of Data Reviewed External Data Reviewed: labs, radiology and notes.  Risk OTC drugs. Prescription drug management. Decision regarding hospitalization. Diagnosis or treatment significantly limited by social determinants of health.           Final Clinical Impression(s) / ED Diagnoses Final diagnoses:  Laceration of left index finger without foreign body with damage to nail, initial encounter    Rx / DC Orders ED Discharge Orders     None          Edelin Fryer A, PA-C 01/23/23 2050    Elayne Snare K, DO 01/24/23 0010

## 2023-01-23 NOTE — ED Provider Triage Note (Signed)
Emergency Medicine Provider Triage Evaluation Note  Nancy Davis , a 47 y.o. female  was evaluated in triage.  Pt complains of left finger lac. Tetanus not up to date. No bony pain  Review of Systems  Positive: Left 2nd digit lac Negative:   Physical Exam  BP (!) 158/91 (BP Location: Right Arm)   Pulse 75   Temp 98.2 F (36.8 C) (Oral)   Resp 18   Ht 5\' 7"  (1.702 m)   Wt 90.3 kg   LMP 01/04/2023 (Approximate)   SpO2 100%   BMI 31.17 kg/m  Gen:   Awake, no distress   Resp:  Normal effort  MSK:   Moves extremities without difficulty  Other:  Stellate 0.5 cm laceration  Medical Decision Making  Medically screening exam initiated at 5:39 PM.  Appropriate orders placed.  KLAUDIA BEIRNE was informed that the remainder of the evaluation will be completed by another provider, this initial triage assessment does not replace that evaluation, and the importance of remaining in the ED until their evaluation is complete.  Finger laceration   Jeremiyah Cullens A, PA-C 01/23/23 2335

## 2023-02-10 ENCOUNTER — Encounter (HOSPITAL_BASED_OUTPATIENT_CLINIC_OR_DEPARTMENT_OTHER): Payer: Self-pay | Admitting: Family Medicine

## 2023-07-02 ENCOUNTER — Encounter (HOSPITAL_BASED_OUTPATIENT_CLINIC_OR_DEPARTMENT_OTHER): Payer: Self-pay

## 2023-07-02 ENCOUNTER — Other Ambulatory Visit: Payer: Self-pay

## 2023-07-02 DIAGNOSIS — W228XXA Striking against or struck by other objects, initial encounter: Secondary | ICD-10-CM | POA: Insufficient documentation

## 2023-07-02 DIAGNOSIS — M79675 Pain in left toe(s): Secondary | ICD-10-CM | POA: Diagnosis not present

## 2023-07-02 DIAGNOSIS — Y92480 Sidewalk as the place of occurrence of the external cause: Secondary | ICD-10-CM | POA: Diagnosis not present

## 2023-07-02 DIAGNOSIS — M79674 Pain in right toe(s): Secondary | ICD-10-CM | POA: Insufficient documentation

## 2023-07-02 NOTE — ED Triage Notes (Signed)
 Pt states that she stumped her big toes on the sidewalk x 2 weeks ago and has had pressure and pain underneath both nails.

## 2023-07-03 ENCOUNTER — Emergency Department (HOSPITAL_BASED_OUTPATIENT_CLINIC_OR_DEPARTMENT_OTHER)

## 2023-07-03 ENCOUNTER — Emergency Department (HOSPITAL_BASED_OUTPATIENT_CLINIC_OR_DEPARTMENT_OTHER)
Admission: EM | Admit: 2023-07-03 | Discharge: 2023-07-03 | Disposition: A | Discharge status: Home / Self Care | Attending: Emergency Medicine | Admitting: Emergency Medicine

## 2023-07-03 DIAGNOSIS — M79675 Pain in left toe(s): Secondary | ICD-10-CM

## 2023-07-03 MED ORDER — IBUPROFEN 800 MG PO TABS
800.0000 mg | ORAL_TABLET | Freq: Once | ORAL | Status: AC
  Administered 2023-07-03: 800 mg via ORAL
  Filled 2023-07-03: qty 1

## 2023-07-03 MED ORDER — ACETAMINOPHEN 500 MG PO TABS
1000.0000 mg | ORAL_TABLET | Freq: Once | ORAL | Status: AC
  Administered 2023-07-03: 1000 mg via ORAL
  Filled 2023-07-03: qty 2

## 2023-07-03 MED ORDER — NAPROXEN 375 MG PO TABS: 375.0000 mg | ORAL_TABLET | Freq: Two times a day (BID) | ORAL | 0 refills | Status: AC

## 2023-07-03 NOTE — ED Provider Notes (Signed)
 [facility] AT [facility] Provider Note   CSN: [id number] Arrival date & time: 07/02/23  2232     History  Chief Complaint  Patient presents with   Toe Injury    Nancy Davis is a 48 y.o. female.  The history is provided by the patient.  Foot Pain This is a new problem. The current episode started more than 1 week ago (2 weeks ago stubbed b great toes). The problem occurs constantly. The problem has not changed since onset.Pertinent negatives include no chest pain, no abdominal pain, no headaches and no shortness of breath. Nothing aggravates the symptoms. Nothing relieves the symptoms. The treatment provided no relief.  Stubbed toes and nails did not look right, now has a gel manicure on toenails.       Home Medications Prior to Admission medications   Medication Sig Start Date End Date Taking? Authorizing Provider  naproxen (NAPROSYN) 375 MG tablet Take 1 tablet (375 mg total) by mouth 2 (two) times daily with a meal. 07/03/23  Yes Jozee Hammer, MD  acetaminophen (TYLENOL) 500 MG tablet Take 500 mg by mouth every 6 (six) hours as needed for mild pain or moderate pain.    [provider]  diclofenac Sodium (VOLTAREN) 1 % GEL Apply 2 g topically 4 (four) times daily. 06/20/21   Autry, Lauren E, PA-C  HYDROcodone-acetaminophen (NORCO/VICODIN) 5-325 MG tablet Take 1 tablet by mouth every 6 (six) hours as needed. 09/25/22   Burnette Carte, MD  ibuprofen (ADVIL) 800 MG tablet Take 1 tablet (800 mg total) by mouth 3 (three) times daily. 10/16/21   Delray Fielding, PA-C  Multiple Vitamins-Minerals (MULTIVITAMIN WITH MINERALS) tablet Take 1 tablet by mouth daily. Woman    [provider]  naphazoline-pheniramine (VISINE) 0.025-0.3 % ophthalmic solution Place 1 drop into both eyes 4 (four) times daily as needed for eye irritation.    [provider]  OVER THE COUNTER MEDICATION Take by mouth daily. Vitamin b 12 sl liquid    [provider]      Allergies    Penicillins and Coconut (cocos nucifera)    Review of Systems   Review of Systems  Respiratory:  Negative for shortness of breath.   Cardiovascular:  Negative for chest pain.  Gastrointestinal:  Negative for abdominal pain.  Musculoskeletal:  Positive for arthralgias.  Neurological:  Negative for headaches.  All other systems reviewed and are negative.   Physical Exam Updated Vital Signs BP 138/73 (BP Location: Left Arm)   Pulse 78   Temp 97.7 F (36.5 C) (Oral)   Resp 18   SpO2 98%  Physical Exam Vitals and nursing note reviewed.  Constitutional:      General: She is not in acute distress.    Appearance: She is well-developed.  HENT:     Head: Normocephalic and atraumatic.     Nose: Nose normal.  Eyes:     Pupils: Pupils are equal, round, and reactive to light.  Cardiovascular:     Rate and Rhythm: Normal rate and regular rhythm.     Pulses: Normal pulses.     Heart sounds: Normal heart sounds.  Pulmonary:     Effort: Pulmonary effort is normal. No respiratory distress.     Breath sounds: Normal breath sounds.  Abdominal:     General: Bowel sounds are normal. There is no distension.     Palpations: Abdomen is soft.     Tenderness: There is no abdominal tenderness.  There is no guarding or rebound.  Musculoskeletal:        General: Normal range of motion.     Cervical back: Neck supple.     Right ankle:     Right Achilles Tendon: Normal.     Left ankle:     Left Achilles Tendon: Normal.     Right foot: Normal range of motion and normal capillary refill. No swelling, deformity, bunion, Charcot foot, foot drop, prominent metatarsal heads, laceration, bony tenderness or crepitus. Normal pulse.     Left foot: Normal range of motion and normal capillary refill. No swelling, deformity, bunion, Charcot foot, foot drop, prominent metatarsal heads, laceration, bony tenderness or crepitus. Normal pulse.  Skin:    General: Skin is warm and  dry.     Capillary Refill: Capillary refill takes less than 2 seconds.     Findings: No erythema or rash.  Neurological:     General: No focal deficit present.     Deep Tendon Reflexes: Reflexes normal.  Psychiatric:        Mood and Affect: Mood normal.     ED Results / Procedures / Treatments   Labs (all labs ordered are listed, but only abnormal results are displayed) Labs Reviewed - No data to display  EKG None  Radiology DG Toe Great Right Result Date: 07/03/2023 CLINICAL DATA:  Stubbed toe 2 weeks ago EXAM: RIGHT GREAT TOE COMPARISON:  None Available. FINDINGS: No acute fracture or dislocation.  Hallux valgus and bunion. IMPRESSION: Negative. Electronically Signed   By: Ronnette Coke M.D.   On: 07/03/2023 04:25   DG Foot 2 Views Left Result Date: 07/03/2023 CLINICAL DATA:  Toe injury 2 weeks ago with pain and pressure beneath the toenail EXAM: LEFT FOOT - 2 VIEW COMPARISON:  None Available. FINDINGS: There is no evidence of fracture or dislocation. Mild hallux valgus and first MTP spurring with bunion. IMPRESSION: No acute finding Electronically Signed   By: Ronnette Coke M.D.   On: 07/03/2023 04:24    Procedures Procedures    Medications Ordered in ED Medications  acetaminophen (TYLENOL) tablet 1,000 mg (1,000 mg Oral Given 07/03/23 0334)  ibuprofen (ADVIL) tablet 800 mg (800 mg Oral Given 07/03/23 0334)    ED Course/ Medical Decision Making/ A&P                                 Medical Decision Making Patient with B great toe pain since stubbing the toes   Amount and/or Complexity of Data Reviewed External Data Reviewed: notes.    Details: Previous notes reviewed  Radiology: ordered and independent interpretation performed.    Details: No fractures   Risk OTC drugs. Prescription drug management. Risk Details: Well appearing. Ice and elevated the feet.  Will start naproxen.  Nails intact without lifting.  No manicures.  Stable for discharge.  Strict  returns.     Final Clinical Impression(s) / ED Diagnoses Final diagnoses:  Pain in toes of both feet    No signs of systemic illness or infection. The patient is nontoxic-appearing on exam and vital signs are within normal limits.  I have reviewed the triage vital signs and the nursing notes. Pertinent labs & imaging results that were available during my care of the patient were reviewed by me and considered in my medical decision making (see chart for details). After history, exam, and medical workup I feel the patient has been  appropriately medically screened and is safe for discharge home. Pertinent diagnoses were discussed with the patient. Patient was given return precautions.    Rx / DC Orders ED Discharge Orders          Ordered    naproxen (NAPROSYN) 375 MG tablet  2 times daily with meals        07/03/23 0433              Alyxandria Wentz, MD 07/03/23 1610

## 2024-02-28 ENCOUNTER — Ambulatory Visit: Admitting: Dermatology
